# Patient Record
Sex: Male | Born: 1950 | Race: White | Hispanic: No | Marital: Single
Health system: Southern US, Community
[De-identification: ages and names within clinical notes are randomized; demographics above are authoritative.]

---

## 2006-08-01 ENCOUNTER — Other Ambulatory Visit: Payer: Self-pay

## 2006-08-01 ENCOUNTER — Emergency Department: Payer: Self-pay | Admitting: Emergency Medicine

## 2007-05-02 ENCOUNTER — Emergency Department: Payer: Self-pay | Admitting: Emergency Medicine

## 2007-05-02 ENCOUNTER — Other Ambulatory Visit: Payer: Self-pay

## 2007-07-28 ENCOUNTER — Other Ambulatory Visit: Payer: Self-pay

## 2007-07-28 ENCOUNTER — Inpatient Hospital Stay: Payer: Self-pay | Admitting: Internal Medicine

## 2008-02-03 ENCOUNTER — Ambulatory Visit: Payer: Self-pay | Admitting: Family Medicine

## 2008-10-17 ENCOUNTER — Emergency Department: Payer: Self-pay | Admitting: Emergency Medicine

## 2009-03-05 ENCOUNTER — Inpatient Hospital Stay: Payer: Self-pay | Admitting: Internal Medicine

## 2010-10-26 ENCOUNTER — Ambulatory Visit: Payer: Self-pay | Admitting: Oncology

## 2010-11-12 ENCOUNTER — Inpatient Hospital Stay: Payer: Self-pay | Admitting: Internal Medicine

## 2010-11-26 ENCOUNTER — Ambulatory Visit: Payer: Self-pay | Admitting: Oncology

## 2012-01-11 ENCOUNTER — Emergency Department: Payer: Self-pay | Admitting: *Deleted

## 2012-01-26 ENCOUNTER — Ambulatory Visit: Payer: Self-pay | Admitting: Internal Medicine

## 2012-02-19 LAB — COMPREHENSIVE METABOLIC PANEL
Albumin: 1.8 g/dL — ABNORMAL LOW (ref 3.4–5.0)
Alkaline Phosphatase: 178 U/L — ABNORMAL HIGH (ref 50–136)
Anion Gap: 20 — ABNORMAL HIGH (ref 7–16)
BUN: 17 mg/dL (ref 7–18)
Co2: 16 mmol/L — ABNORMAL LOW (ref 21–32)
EGFR (Non-African Amer.): 51 — ABNORMAL LOW
Glucose: 28 mg/dL — CL (ref 65–99)
Osmolality: 253 (ref 275–301)
Potassium: 3.1 mmol/L — ABNORMAL LOW (ref 3.5–5.1)
SGOT(AST): 187 U/L — ABNORMAL HIGH (ref 15–37)
SGPT (ALT): 53 U/L
Sodium: 127 mmol/L — ABNORMAL LOW (ref 136–145)
Total Protein: 5.8 g/dL — ABNORMAL LOW (ref 6.4–8.2)

## 2012-02-19 LAB — CBC WITH DIFFERENTIAL/PLATELET
Basophil %: 0.2 %
Eosinophil #: 0 10*3/uL (ref 0.0–0.7)
HCT: 36.4 % — ABNORMAL LOW (ref 40.0–52.0)
HGB: 11.9 g/dL — ABNORMAL LOW (ref 13.0–18.0)
Lymphocyte #: 0.2 10*3/uL — ABNORMAL LOW (ref 1.0–3.6)
Lymphocyte %: 8.8 %
MCH: 27.9 pg (ref 26.0–34.0)
MCV: 86 fL (ref 80–100)
Monocyte %: 0.6 %
Neutrophil %: 90.1 %

## 2012-02-19 LAB — CK TOTAL AND CKMB (NOT AT ARMC): CK, Total: 981 U/L — ABNORMAL HIGH (ref 35–232)

## 2012-02-19 LAB — TROPONIN I: Troponin-I: 0.17 ng/mL — ABNORMAL HIGH

## 2012-02-20 ENCOUNTER — Inpatient Hospital Stay: Payer: Self-pay | Admitting: Internal Medicine

## 2012-02-20 LAB — TROPONIN I: Troponin-I: 0.18 ng/mL — ABNORMAL HIGH

## 2012-02-20 LAB — BASIC METABOLIC PANEL
Anion Gap: 13 (ref 7–16)
BUN: 22 mg/dL — ABNORMAL HIGH (ref 7–18)
Chloride: 99 mmol/L (ref 98–107)
Creatinine: 1.58 mg/dL — ABNORMAL HIGH (ref 0.60–1.30)
EGFR (African American): 54 — ABNORMAL LOW
Glucose: 138 mg/dL — ABNORMAL HIGH (ref 65–99)
Osmolality: 264 (ref 275–301)
Sodium: 129 mmol/L — ABNORMAL LOW (ref 136–145)

## 2012-02-20 LAB — LIPID PANEL: VLDL Cholesterol, Calc: 20 mg/dL (ref 5–40)

## 2012-02-20 LAB — URINALYSIS, COMPLETE
Bilirubin,UR: NEGATIVE
Glucose,UR: 50 mg/dL (ref 0–75)
Hyaline Cast: 4
Ketone: NEGATIVE
Leukocyte Esterase: NEGATIVE
Ph: 5 (ref 4.5–8.0)
Protein: 100
RBC,UR: 14 /HPF (ref 0–5)
Specific Gravity: 1.014 (ref 1.003–1.030)
WBC UR: 5 /HPF (ref 0–5)

## 2012-02-20 LAB — DRUG SCREEN, URINE
Cocaine Metabolite,Ur ~~LOC~~: NEGATIVE (ref ?–300)
MDMA (Ecstasy)Ur Screen: NEGATIVE (ref ?–500)
Methadone, Ur Screen: NEGATIVE (ref ?–300)
Opiate, Ur Screen: POSITIVE (ref ?–300)
Phencyclidine (PCP) Ur S: NEGATIVE (ref ?–25)
Tricyclic, Ur Screen: NEGATIVE (ref ?–1000)

## 2012-02-20 LAB — FOLATE: Folic Acid: 5.3 ng/mL (ref 3.1–100.0)

## 2012-02-20 LAB — HEMOGLOBIN A1C: Hemoglobin A1C: 5.5 % (ref 4.2–6.3)

## 2012-02-20 LAB — CK TOTAL AND CKMB (NOT AT ARMC)
CK, Total: 1312 U/L — ABNORMAL HIGH (ref 35–232)
CK, Total: 843 U/L — ABNORMAL HIGH (ref 35–232)
CK-MB: 11.6 ng/mL — ABNORMAL HIGH (ref 0.5–3.6)
CK-MB: 13.1 ng/mL — ABNORMAL HIGH (ref 0.5–3.6)

## 2012-02-21 LAB — CBC WITH DIFFERENTIAL/PLATELET
Basophil #: 0 10*3/uL (ref 0.0–0.1)
Basophil %: 0.3 %
Eosinophil #: 0.1 10*3/uL (ref 0.0–0.7)
Eosinophil %: 1.3 %
HCT: 29.8 % — ABNORMAL LOW (ref 40.0–52.0)
MCH: 28.4 pg (ref 26.0–34.0)
MCHC: 33.9 g/dL (ref 32.0–36.0)
MCV: 84 fL (ref 80–100)
Monocyte #: 0 x10 3/mm — ABNORMAL LOW (ref 0.2–1.0)
Monocyte %: 0.3 %
Platelet: 53 10*3/uL — ABNORMAL LOW (ref 150–440)
RBC: 3.56 10*6/uL — ABNORMAL LOW (ref 4.40–5.90)
RDW: 15.3 % — ABNORMAL HIGH (ref 11.5–14.5)
WBC: 4.5 10*3/uL (ref 3.8–10.6)

## 2012-02-21 LAB — COMPREHENSIVE METABOLIC PANEL
Albumin: 1.6 g/dL — ABNORMAL LOW (ref 3.4–5.0)
Anion Gap: 12 (ref 7–16)
BUN: 22 mg/dL — ABNORMAL HIGH (ref 7–18)
Bilirubin,Total: 1.5 mg/dL — ABNORMAL HIGH (ref 0.2–1.0)
Chloride: 103 mmol/L (ref 98–107)
Creatinine: 1.11 mg/dL (ref 0.60–1.30)
EGFR (Non-African Amer.): >60
Osmolality: 279 (ref 275–301)
Potassium: 3 mmol/L — ABNORMAL LOW (ref 3.5–5.1)
SGOT(AST): 191 U/L — ABNORMAL HIGH (ref 15–37)
SGPT (ALT): 68 U/L
Total Protein: 5.4 g/dL — ABNORMAL LOW (ref 6.4–8.2)

## 2012-02-21 LAB — PROTIME-INR: Prothrombin Time: 14.5 secs (ref 11.5–14.7)

## 2012-02-21 LAB — POTASSIUM: Potassium: 3.7 mmol/L (ref 3.5–5.1)

## 2012-02-21 LAB — HEMOGLOBIN A1C: Hemoglobin A1C: 5.5 % (ref 4.2–6.3)

## 2012-02-21 LAB — MAGNESIUM: Magnesium: 2 mg/dL

## 2012-02-21 LAB — PHENYTOIN LEVEL, TOTAL: Dilantin: 5.6 ug/mL — ABNORMAL LOW (ref 10.0–20.0)

## 2012-02-21 LAB — AMMONIA: Ammonia, Plasma: <25 mcmol/L (ref 11–32)

## 2012-02-22 LAB — BASIC METABOLIC PANEL
Calcium, Total: 8.2 mg/dL — ABNORMAL LOW (ref 8.5–10.1)
Chloride: 108 mmol/L — ABNORMAL HIGH (ref 98–107)
EGFR (African American): >60
EGFR (Non-African Amer.): >60
Glucose: 143 mg/dL — ABNORMAL HIGH (ref 65–99)
Osmolality: 282 (ref 275–301)
Sodium: 138 mmol/L (ref 136–145)

## 2012-02-22 LAB — CBC WITH DIFFERENTIAL/PLATELET
Basophil %: 0.3 %
Eosinophil #: 0 10*3/uL (ref 0.0–0.7)
Eosinophil %: 0.2 %
Lymphocyte #: 0.6 10*3/uL — ABNORMAL LOW (ref 1.0–3.6)
MCH: 27.9 pg (ref 26.0–34.0)
MCHC: 33.4 g/dL (ref 32.0–36.0)
Monocyte #: 0.1 x10 3/mm — ABNORMAL LOW (ref 0.2–1.0)
Monocyte %: 3.5 %
Neutrophil #: 3 10*3/uL (ref 1.4–6.5)
Neutrophil %: 80.1 %
RBC: 4.2 10*6/uL — ABNORMAL LOW (ref 4.40–5.90)
WBC: 3.8 10*3/uL (ref 3.8–10.6)

## 2012-02-23 LAB — DIFFERENTIAL
Basophil #: 0 10*3/uL (ref 0.0–0.1)
Eosinophil #: 0 10*3/uL (ref 0.0–0.7)
Lymphocyte %: 14.9 %
Monocyte #: 0.1 x10 3/mm — ABNORMAL LOW (ref 0.2–1.0)
Monocyte %: 4.2 %
Neutrophil %: 80.7 %

## 2012-02-24 LAB — POTASSIUM: Potassium: 3.6 mmol/L (ref 3.5–5.1)

## 2012-02-24 LAB — PHOSPHORUS: Phosphorus: 3 mg/dL (ref 2.5–4.9)

## 2012-02-24 LAB — MAGNESIUM: Magnesium: 1.6 mg/dL — ABNORMAL LOW

## 2012-02-24 LAB — CALCIUM: Calcium, Total: 7.6 mg/dL — ABNORMAL LOW (ref 8.5–10.1)

## 2012-02-25 ENCOUNTER — Ambulatory Visit: Payer: Self-pay | Admitting: Internal Medicine

## 2012-02-25 LAB — CALCIUM: Calcium, Total: 7.5 mg/dL — ABNORMAL LOW (ref 8.5–10.1)

## 2012-02-25 LAB — PHOSPHORUS: Phosphorus: 3.1 mg/dL (ref 2.5–4.9)

## 2012-02-25 LAB — POTASSIUM: Potassium: 3.8 mmol/L (ref 3.5–5.1)

## 2012-02-26 LAB — POTASSIUM: Potassium: 3.9 mmol/L (ref 3.5–5.1)

## 2012-02-26 LAB — PHOSPHORUS: Phosphorus: 2.9 mg/dL (ref 2.5–4.9)

## 2012-02-26 LAB — CALCIUM: Calcium, Total: 7.4 mg/dL — ABNORMAL LOW (ref 8.5–10.1)

## 2012-02-29 LAB — CULTURE, BLOOD (SINGLE)

## 2012-03-19 LAB — CULTURE, BLOOD (SINGLE)

## 2014-04-04 ENCOUNTER — Emergency Department: Payer: Self-pay | Admitting: Emergency Medicine

## 2014-04-04 LAB — CBC WITH DIFFERENTIAL/PLATELET
Comment - H1-Com1: NORMAL
Eosinophil: 1 %
HCT: 43.8 % (ref 40.0–52.0)
HGB: 14.8 g/dL (ref 13.0–18.0)
Lymphocytes: 75 %
MCH: 30.8 pg (ref 26.0–34.0)
MCHC: 33.8 g/dL (ref 32.0–36.0)
MCV: 91 fL (ref 80–100)
MONOS PCT: 8 %
Platelet: 139 10*3/uL — ABNORMAL LOW (ref 150–440)
RBC: 4.8 10*6/uL (ref 4.40–5.90)
RDW: 14.4 % (ref 11.5–14.5)
Segmented Neutrophils: 10 %
Variant Lymphocyte - H1-Rlymph: 6 %
WBC: 3.3 10*3/uL — ABNORMAL LOW (ref 3.8–10.6)

## 2014-04-05 LAB — URINALYSIS, COMPLETE
BILIRUBIN, UR: NEGATIVE
Bacteria: NONE SEEN
Blood: NEGATIVE
GLUCOSE, UR: NEGATIVE mg/dL (ref 0–75)
KETONE: NEGATIVE
LEUKOCYTE ESTERASE: NEGATIVE
NITRITE: NEGATIVE
PH: 5 (ref 4.5–8.0)
Protein: NEGATIVE
RBC,UR: 1 /HPF (ref 0–5)
Specific Gravity: 1.006 (ref 1.003–1.030)
Squamous Epithelial: NONE SEEN

## 2014-04-05 LAB — DRUG SCREEN, URINE

## 2014-04-05 LAB — COMPREHENSIVE METABOLIC PANEL
ALBUMIN: 3.3 g/dL — AB (ref 3.4–5.0)
ALT: 15 U/L
AST: 20 U/L (ref 15–37)
Alkaline Phosphatase: 98 U/L
Anion Gap: 8 (ref 7–16)
BUN: 12 mg/dL (ref 7–18)
Bilirubin,Total: 0.4 mg/dL (ref 0.2–1.0)
CREATININE: 0.85 mg/dL (ref 0.60–1.30)
Calcium, Total: 7.8 mg/dL — ABNORMAL LOW (ref 8.5–10.1)
Chloride: 98 mmol/L (ref 98–107)
Co2: 25 mmol/L (ref 21–32)
EGFR (African American): >60
EGFR (Non-African Amer.): >60
GLUCOSE: 84 mg/dL (ref 65–99)
Osmolality: 262 (ref 275–301)
Potassium: 4 mmol/L (ref 3.5–5.1)
SODIUM: 131 mmol/L — AB (ref 136–145)
TOTAL PROTEIN: 6.7 g/dL (ref 6.4–8.2)

## 2014-04-05 LAB — ETHANOL
ETHANOL LVL: 168 mg/dL
Ethanol %: 0.168 % — ABNORMAL HIGH (ref 0.000–0.080)

## 2014-06-27 ENCOUNTER — Ambulatory Visit: Payer: Self-pay | Admitting: Nurse Practitioner

## 2014-07-20 ENCOUNTER — Inpatient Hospital Stay: Payer: Self-pay | Admitting: Internal Medicine

## 2014-07-20 LAB — CBC
HCT: 42.5 % (ref 40.0–52.0)
HGB: 14.1 g/dL (ref 13.0–18.0)
MCH: 29.2 pg (ref 26.0–34.0)
MCHC: 33.1 g/dL (ref 32.0–36.0)
MCV: 88 fL (ref 80–100)
Platelet: 276 10*3/uL (ref 150–440)
RBC: 4.81 10*6/uL (ref 4.40–5.90)
RDW: 14.7 % — ABNORMAL HIGH (ref 11.5–14.5)
WBC: 4.4 10*3/uL (ref 3.8–10.6)

## 2014-07-20 LAB — COMPREHENSIVE METABOLIC PANEL
ALT: 23 U/L
Albumin: 3.3 g/dL — ABNORMAL LOW (ref 3.4–5.0)
Alkaline Phosphatase: 148 U/L — ABNORMAL HIGH
Anion Gap: 8 (ref 7–16)
BUN: 28 mg/dL — ABNORMAL HIGH (ref 7–18)
Bilirubin,Total: 0.7 mg/dL (ref 0.2–1.0)
CHLORIDE: 101 mmol/L (ref 98–107)
Calcium, Total: 8.3 mg/dL — ABNORMAL LOW (ref 8.5–10.1)
Co2: 24 mmol/L (ref 21–32)
Creatinine: 1.3 mg/dL (ref 0.60–1.30)
EGFR (African American): >60
GFR CALC NON AF AMER: 59 — AB
Glucose: 93 mg/dL (ref 65–99)
Osmolality: 272 (ref 275–301)
Potassium: 4.2 mmol/L (ref 3.5–5.1)
SGOT(AST): 42 U/L — ABNORMAL HIGH (ref 15–37)
Sodium: 133 mmol/L — ABNORMAL LOW (ref 136–145)
Total Protein: 7.5 g/dL (ref 6.4–8.2)

## 2014-07-20 LAB — TROPONIN I: Troponin-I: <0.02 ng/mL

## 2014-07-20 LAB — ETHANOL: Ethanol: <3 mg/dL

## 2014-07-21 LAB — URINALYSIS, COMPLETE
Bilirubin,UR: NEGATIVE
GLUCOSE, UR: NEGATIVE mg/dL (ref 0–75)
Hyaline Cast: 6
Nitrite: NEGATIVE
Ph: 5 (ref 4.5–8.0)
Protein: NEGATIVE
RBC,UR: <1 /HPF (ref 0–5)
Specific Gravity: 1.019 (ref 1.003–1.030)
Squamous Epithelial: NONE SEEN
WBC UR: 9 /HPF (ref 0–5)

## 2014-07-21 LAB — DRUG SCREEN, URINE
AMPHETAMINES, UR SCREEN: NEGATIVE (ref ?–1000)
Barbiturates, Ur Screen: NEGATIVE (ref ?–200)
Benzodiazepine, Ur Scrn: NEGATIVE (ref ?–200)
COCAINE METABOLITE, UR ~~LOC~~: NEGATIVE (ref ?–300)
Cannabinoid 50 Ng, Ur ~~LOC~~: NEGATIVE (ref ?–50)
MDMA (ECSTASY) UR SCREEN: NEGATIVE (ref ?–500)
Methadone, Ur Screen: NEGATIVE (ref ?–300)
Opiate, Ur Screen: NEGATIVE (ref ?–300)
PHENCYCLIDINE (PCP) UR S: NEGATIVE (ref ?–25)
TRICYCLIC, UR SCREEN: NEGATIVE (ref ?–1000)

## 2014-07-21 LAB — PHOSPHORUS: Phosphorus: 3.2 mg/dL (ref 2.5–4.9)

## 2014-07-21 LAB — AMMONIA: AMMONIA, PLASMA: 11 umol/L (ref 11–32)

## 2014-07-21 LAB — MAGNESIUM: MAGNESIUM: 1.7 mg/dL — AB

## 2014-07-22 LAB — BASIC METABOLIC PANEL
ANION GAP: 6 — AB (ref 7–16)
BUN: 10 mg/dL (ref 7–18)
CO2: 23 mmol/L (ref 21–32)
CREATININE: 0.65 mg/dL (ref 0.60–1.30)
Calcium, Total: 7.7 mg/dL — ABNORMAL LOW (ref 8.5–10.1)
Chloride: 110 mmol/L — ABNORMAL HIGH (ref 98–107)
EGFR (African American): >60
EGFR (Non-African Amer.): >60
Glucose: 91 mg/dL (ref 65–99)
Osmolality: 276 (ref 275–301)
Potassium: 3.6 mmol/L (ref 3.5–5.1)
Sodium: 139 mmol/L (ref 136–145)

## 2014-07-22 LAB — CBC WITH DIFFERENTIAL/PLATELET
BASOS ABS: 0 10*3/uL (ref 0.0–0.1)
Basophil %: 0.8 %
EOS ABS: 0 10*3/uL (ref 0.0–0.7)
Eosinophil %: 1.1 %
HCT: 39.6 % — ABNORMAL LOW (ref 40.0–52.0)
HGB: 13.1 g/dL (ref 13.0–18.0)
LYMPHS ABS: 0.8 10*3/uL — AB (ref 1.0–3.6)
LYMPHS PCT: 28.9 %
MCH: 29.3 pg (ref 26.0–34.0)
MCHC: 33.1 g/dL (ref 32.0–36.0)
MCV: 89 fL (ref 80–100)
MONOS PCT: 16.7 %
Monocyte #: 0.4 x10 3/mm (ref 0.2–1.0)
NEUTROS PCT: 52.5 %
Neutrophil #: 1.4 10*3/uL (ref 1.4–6.5)
Platelet: 209 10*3/uL (ref 150–440)
RBC: 4.47 10*6/uL (ref 4.40–5.90)
RDW: 14.6 % — AB (ref 11.5–14.5)
WBC: 2.7 10*3/uL — AB (ref 3.8–10.6)

## 2014-07-22 LAB — MAGNESIUM: Magnesium: 1.7 mg/dL — ABNORMAL LOW

## 2014-07-23 LAB — BASIC METABOLIC PANEL
ANION GAP: 6 — AB (ref 7–16)
BUN: 20 mg/dL — AB (ref 7–18)
CALCIUM: 7.9 mg/dL — AB (ref 8.5–10.1)
CHLORIDE: 111 mmol/L — AB (ref 98–107)
CREATININE: 2.06 mg/dL — AB (ref 0.60–1.30)
Co2: 23 mmol/L (ref 21–32)
EGFR (African American): 42 — ABNORMAL LOW
EGFR (Non-African Amer.): 35 — ABNORMAL LOW
Glucose: 88 mg/dL (ref 65–99)
Osmolality: 281 (ref 275–301)
POTASSIUM: 4.5 mmol/L (ref 3.5–5.1)
SODIUM: 140 mmol/L (ref 136–145)

## 2014-07-23 LAB — CBC WITH DIFFERENTIAL/PLATELET
BASOS ABS: 0 10*3/uL (ref 0.0–0.1)
Basophil %: 0.3 %
EOS PCT: 0 %
Eosinophil #: 0 10*3/uL (ref 0.0–0.7)
HCT: 38.3 % — ABNORMAL LOW (ref 40.0–52.0)
HGB: 12.4 g/dL — AB (ref 13.0–18.0)
Lymphocyte #: 0.8 10*3/uL — ABNORMAL LOW (ref 1.0–3.6)
Lymphocyte %: 11.3 %
MCH: 29.3 pg (ref 26.0–34.0)
MCHC: 32.5 g/dL (ref 32.0–36.0)
MCV: 90 fL (ref 80–100)
Monocyte #: 0.9 x10 3/mm (ref 0.2–1.0)
Monocyte %: 13.5 %
NEUTROS PCT: 74.9 %
Neutrophil #: 5.1 10*3/uL (ref 1.4–6.5)
PLATELETS: 269 10*3/uL (ref 150–440)
RBC: 4.24 10*6/uL — ABNORMAL LOW (ref 4.40–5.90)
RDW: 14.6 % — ABNORMAL HIGH (ref 11.5–14.5)
WBC: 6.8 10*3/uL (ref 3.8–10.6)

## 2014-07-23 LAB — PHOSPHORUS: PHOSPHORUS: 6.2 mg/dL — AB (ref 2.5–4.9)

## 2014-07-23 LAB — MAGNESIUM
MAGNESIUM: 1.7 mg/dL — AB
MAGNESIUM: 2.5 mg/dL — AB

## 2014-07-24 LAB — BASIC METABOLIC PANEL
ANION GAP: 9 (ref 7–16)
BUN: 24 mg/dL — AB (ref 7–18)
CALCIUM: 7.6 mg/dL — AB (ref 8.5–10.1)
CHLORIDE: 112 mmol/L — AB (ref 98–107)
Co2: 18 mmol/L — ABNORMAL LOW (ref 21–32)
Creatinine: 2.08 mg/dL — ABNORMAL HIGH (ref 0.60–1.30)
EGFR (Non-African Amer.): 34 — ABNORMAL LOW
GFR CALC AF AMER: 42 — AB
Glucose: 114 mg/dL — ABNORMAL HIGH (ref 65–99)
OSMOLALITY: 282 (ref 275–301)
POTASSIUM: 3.3 mmol/L — AB (ref 3.5–5.1)
SODIUM: 139 mmol/L (ref 136–145)

## 2014-07-24 LAB — PHOSPHORUS: PHOSPHORUS: 3.6 mg/dL (ref 2.5–4.9)

## 2014-07-24 LAB — MAGNESIUM: Magnesium: 2.6 mg/dL — ABNORMAL HIGH

## 2014-07-25 LAB — CBC WITH DIFFERENTIAL/PLATELET
BASOS PCT: 0.7 %
Basophil #: 0 10*3/uL (ref 0.0–0.1)
EOS ABS: 0.1 10*3/uL (ref 0.0–0.7)
Eosinophil %: 1.5 %
HCT: 35 % — ABNORMAL LOW (ref 40.0–52.0)
HGB: 11.5 g/dL — ABNORMAL LOW (ref 13.0–18.0)
LYMPHS PCT: 29 %
Lymphocyte #: 1.1 10*3/uL (ref 1.0–3.6)
MCH: 28.9 pg (ref 26.0–34.0)
MCHC: 32.9 g/dL (ref 32.0–36.0)
MCV: 88 fL (ref 80–100)
MONOS PCT: 12 %
Monocyte #: 0.4 x10 3/mm (ref 0.2–1.0)
NEUTROS PCT: 56.8 %
Neutrophil #: 2.1 10*3/uL (ref 1.4–6.5)
Platelet: 213 10*3/uL (ref 150–440)
RBC: 3.98 10*6/uL — ABNORMAL LOW (ref 4.40–5.90)
RDW: 15.4 % — ABNORMAL HIGH (ref 11.5–14.5)
WBC: 3.7 10*3/uL — ABNORMAL LOW (ref 3.8–10.6)

## 2014-07-25 LAB — PHENYTOIN LEVEL, TOTAL: Dilantin: 13.6 ug/mL (ref 10.0–20.0)

## 2014-07-25 LAB — CULTURE, BLOOD (SINGLE)

## 2014-07-25 LAB — BASIC METABOLIC PANEL
Anion Gap: 9 (ref 7–16)
BUN: 29 mg/dL — ABNORMAL HIGH (ref 7–18)
CHLORIDE: 115 mmol/L — AB (ref 98–107)
Calcium, Total: 7.4 mg/dL — ABNORMAL LOW (ref 8.5–10.1)
Co2: 19 mmol/L — ABNORMAL LOW (ref 21–32)
Creatinine: 1.91 mg/dL — ABNORMAL HIGH (ref 0.60–1.30)
EGFR (African American): 46 — ABNORMAL LOW
EGFR (Non-African Amer.): 38 — ABNORMAL LOW
Glucose: 125 mg/dL — ABNORMAL HIGH (ref 65–99)
OSMOLALITY: 292 (ref 275–301)
Potassium: 3.5 mmol/L (ref 3.5–5.1)
SODIUM: 143 mmol/L (ref 136–145)

## 2014-07-26 LAB — BASIC METABOLIC PANEL
ANION GAP: 6 — AB (ref 7–16)
BUN: 33 mg/dL — ABNORMAL HIGH (ref 7–18)
CALCIUM: 7.5 mg/dL — AB (ref 8.5–10.1)
CHLORIDE: 119 mmol/L — AB (ref 98–107)
Co2: 21 mmol/L (ref 21–32)
Creatinine: 1.6 mg/dL — ABNORMAL HIGH (ref 0.60–1.30)
EGFR (Non-African Amer.): 47 — ABNORMAL LOW
GFR CALC AF AMER: 57 — AB
Glucose: 86 mg/dL (ref 65–99)
OSMOLALITY: 297 (ref 275–301)
POTASSIUM: 3.5 mmol/L (ref 3.5–5.1)
Sodium: 146 mmol/L — ABNORMAL HIGH (ref 136–145)

## 2014-07-26 LAB — PHOSPHORUS: Phosphorus: 2.9 mg/dL (ref 2.5–4.9)

## 2014-07-26 LAB — EXPECTORATED SPUTUM ASSESSMENT W REFEX TO RESP CULTURE

## 2014-07-26 LAB — MAGNESIUM: MAGNESIUM: 1.9 mg/dL

## 2014-07-27 ENCOUNTER — Ambulatory Visit: Payer: Self-pay | Admitting: Nurse Practitioner

## 2014-07-27 LAB — BASIC METABOLIC PANEL
Anion Gap: 9 (ref 7–16)
BUN: 30 mg/dL — ABNORMAL HIGH (ref 7–18)
CALCIUM: 7.5 mg/dL — AB (ref 8.5–10.1)
Chloride: 117 mmol/L — ABNORMAL HIGH (ref 98–107)
Co2: 22 mmol/L (ref 21–32)
Creatinine: 1.42 mg/dL — ABNORMAL HIGH (ref 0.60–1.30)
EGFR (African American): >60
EGFR (Non-African Amer.): 54 — ABNORMAL LOW
GLUCOSE: 122 mg/dL — AB (ref 65–99)
Osmolality: 302 (ref 275–301)
Potassium: 3.4 mmol/L — ABNORMAL LOW (ref 3.5–5.1)
SODIUM: 148 mmol/L — AB (ref 136–145)

## 2014-07-27 LAB — POTASSIUM: Potassium: 3.9 mmol/L (ref 3.5–5.1)

## 2014-07-27 LAB — ALBUMIN: Albumin: 1.6 g/dL — ABNORMAL LOW (ref 3.4–5.0)

## 2014-07-27 LAB — MAGNESIUM: MAGNESIUM: 1.8 mg/dL

## 2014-07-27 LAB — PHOSPHORUS: Phosphorus: 3.1 mg/dL (ref 2.5–4.9)

## 2014-07-28 LAB — MAGNESIUM: Magnesium: 2 mg/dL

## 2014-07-28 LAB — CLOSTRIDIUM DIFFICILE(ARMC)

## 2014-07-28 LAB — BASIC METABOLIC PANEL
Anion Gap: 7 (ref 7–16)
BUN: 26 mg/dL — AB (ref 7–18)
CALCIUM: 7.8 mg/dL — AB (ref 8.5–10.1)
CHLORIDE: 115 mmol/L — AB (ref 98–107)
CO2: 25 mmol/L (ref 21–32)
CREATININE: 1.12 mg/dL (ref 0.60–1.30)
EGFR (African American): >60
EGFR (Non-African Amer.): >60
GLUCOSE: 95 mg/dL (ref 65–99)
OSMOLALITY: 297 (ref 275–301)
Potassium: 3.6 mmol/L (ref 3.5–5.1)
SODIUM: 147 mmol/L — AB (ref 136–145)

## 2014-07-28 LAB — PHOSPHORUS: Phosphorus: 3.7 mg/dL (ref 2.5–4.9)

## 2014-07-29 LAB — PHENYTOIN LEVEL, TOTAL: Dilantin: 7.6 ug/mL — ABNORMAL LOW (ref 10.0–20.0)

## 2014-07-29 LAB — BASIC METABOLIC PANEL
ANION GAP: 8 (ref 7–16)
BUN: 23 mg/dL — ABNORMAL HIGH (ref 7–18)
Calcium, Total: 7.6 mg/dL — ABNORMAL LOW (ref 8.5–10.1)
Chloride: 113 mmol/L — ABNORMAL HIGH (ref 98–107)
Co2: 25 mmol/L (ref 21–32)
Creatinine: 1.09 mg/dL (ref 0.60–1.30)
EGFR (Non-African Amer.): >60
Glucose: 98 mg/dL (ref 65–99)
OSMOLALITY: 294 (ref 275–301)
POTASSIUM: 3.5 mmol/L (ref 3.5–5.1)
Sodium: 146 mmol/L — ABNORMAL HIGH (ref 136–145)

## 2014-07-29 LAB — PLATELET COUNT: PLATELETS: 185 10*3/uL (ref 150–440)

## 2014-07-29 LAB — PHOSPHORUS: PHOSPHORUS: 3.6 mg/dL (ref 2.5–4.9)

## 2014-07-29 LAB — MAGNESIUM: MAGNESIUM: 1.6 mg/dL — AB

## 2014-07-30 LAB — BASIC METABOLIC PANEL
Anion Gap: 10 (ref 7–16)
BUN: 13 mg/dL (ref 7–18)
CO2: 25 mmol/L (ref 21–32)
CREATININE: 1.04 mg/dL (ref 0.60–1.30)
Calcium, Total: 7.6 mg/dL — ABNORMAL LOW (ref 8.5–10.1)
Chloride: 105 mmol/L (ref 98–107)
EGFR (African American): >60
EGFR (Non-African Amer.): >60
Glucose: 103 mg/dL — ABNORMAL HIGH (ref 65–99)
Osmolality: 280 (ref 275–301)
POTASSIUM: 3.8 mmol/L (ref 3.5–5.1)
Sodium: 140 mmol/L (ref 136–145)

## 2014-07-30 LAB — MAGNESIUM: MAGNESIUM: 1.6 mg/dL — AB

## 2014-07-31 LAB — BASIC METABOLIC PANEL
Anion Gap: 5 — ABNORMAL LOW (ref 7–16)
BUN: 11 mg/dL (ref 7–18)
Calcium, Total: 7.7 mg/dL — ABNORMAL LOW (ref 8.5–10.1)
Chloride: 100 mmol/L (ref 98–107)
Co2: 29 mmol/L (ref 21–32)
Creatinine: 0.89 mg/dL (ref 0.60–1.30)
EGFR (African American): >60
EGFR (Non-African Amer.): >60
Glucose: 96 mg/dL (ref 65–99)
Osmolality: 268 (ref 275–301)
Potassium: 3.7 mmol/L (ref 3.5–5.1)
SODIUM: 134 mmol/L — AB (ref 136–145)

## 2014-07-31 LAB — CBC WITH DIFFERENTIAL/PLATELET
BASOS ABS: 0 10*3/uL (ref 0.0–0.1)
BASOS PCT: 0.7 %
EOS PCT: 3.8 %
Eosinophil #: 0.1 10*3/uL (ref 0.0–0.7)
HCT: 37.1 % — AB (ref 40.0–52.0)
HGB: 12.2 g/dL — AB (ref 13.0–18.0)
Lymphocyte #: 1.7 10*3/uL (ref 1.0–3.6)
Lymphocyte %: 43.2 %
MCH: 28.4 pg (ref 26.0–34.0)
MCHC: 32.9 g/dL (ref 32.0–36.0)
MCV: 86 fL (ref 80–100)
Monocyte #: 0.5 x10 3/mm (ref 0.2–1.0)
Monocyte %: 11.5 %
NEUTROS PCT: 40.8 %
Neutrophil #: 1.6 10*3/uL (ref 1.4–6.5)
PLATELETS: 201 10*3/uL (ref 150–440)
RBC: 4.3 10*6/uL — AB (ref 4.40–5.90)
RDW: 14.5 % (ref 11.5–14.5)
WBC: 3.9 10*3/uL (ref 3.8–10.6)

## 2014-07-31 LAB — MAGNESIUM: Magnesium: 1.6 mg/dL — ABNORMAL LOW

## 2014-08-01 LAB — MAGNESIUM: Magnesium: 1.8 mg/dL

## 2014-08-02 LAB — PHENYTOIN LEVEL, TOTAL: Dilantin: 5.2 ug/mL — ABNORMAL LOW (ref 10.0–20.0)

## 2014-08-03 LAB — BASIC METABOLIC PANEL
ANION GAP: 5 — AB (ref 7–16)
BUN: 13 mg/dL (ref 7–18)
CREATININE: 1.02 mg/dL (ref 0.60–1.30)
Calcium, Total: 8.2 mg/dL — ABNORMAL LOW (ref 8.5–10.1)
Chloride: 98 mmol/L (ref 98–107)
Co2: 33 mmol/L — ABNORMAL HIGH (ref 21–32)
EGFR (African American): >60
EGFR (Non-African Amer.): >60
Glucose: 136 mg/dL — ABNORMAL HIGH (ref 65–99)
OSMOLALITY: 274 (ref 275–301)
POTASSIUM: 3.7 mmol/L (ref 3.5–5.1)
Sodium: 136 mmol/L (ref 136–145)

## 2014-08-04 ENCOUNTER — Non-Acute Institutional Stay (SKILLED_NURSING_FACILITY): Payer: Medicaid Other | Admitting: Internal Medicine

## 2014-08-04 DIAGNOSIS — J9600 Acute respiratory failure, unspecified whether with hypoxia or hypercapnia: Secondary | ICD-10-CM

## 2014-08-04 DIAGNOSIS — G40901 Epilepsy, unspecified, not intractable, with status epilepticus: Secondary | ICD-10-CM

## 2014-08-04 DIAGNOSIS — F1010 Alcohol abuse, uncomplicated: Secondary | ICD-10-CM

## 2014-08-04 DIAGNOSIS — G312 Degeneration of nervous system due to alcohol: Secondary | ICD-10-CM

## 2014-08-04 DIAGNOSIS — G3281 Cerebellar ataxia in diseases classified elsewhere: Secondary | ICD-10-CM

## 2014-08-04 DIAGNOSIS — J441 Chronic obstructive pulmonary disease with (acute) exacerbation: Secondary | ICD-10-CM

## 2014-08-04 DIAGNOSIS — F1020 Alcohol dependence, uncomplicated: Secondary | ICD-10-CM

## 2014-08-04 DIAGNOSIS — G40301 Generalized idiopathic epilepsy and epileptic syndromes, not intractable, with status epilepticus: Secondary | ICD-10-CM
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
## 2014-08-11 ENCOUNTER — Non-Acute Institutional Stay (SKILLED_NURSING_FACILITY): Payer: Medicaid Other | Admitting: Internal Medicine

## 2014-08-11 DIAGNOSIS — G40301 Generalized idiopathic epilepsy and epileptic syndromes, not intractable, with status epilepticus: Secondary | ICD-10-CM

## 2014-08-11 DIAGNOSIS — G40901 Epilepsy, unspecified, not intractable, with status epilepticus: Secondary | ICD-10-CM

## 2014-08-11 DIAGNOSIS — E559 Vitamin D deficiency, unspecified: Secondary | ICD-10-CM

## 2014-08-11 DIAGNOSIS — G312 Degeneration of nervous system due to alcohol: Principal | ICD-10-CM

## 2014-08-11 DIAGNOSIS — F1020 Alcohol dependence, uncomplicated: Secondary | ICD-10-CM

## 2014-08-11 DIAGNOSIS — G3281 Cerebellar ataxia in diseases classified elsewhere: Secondary | ICD-10-CM

## 2014-08-13 DIAGNOSIS — F1010 Alcohol abuse, uncomplicated: Secondary | ICD-10-CM | POA: Insufficient documentation

## 2014-08-13 DIAGNOSIS — G312 Degeneration of nervous system due to alcohol: Secondary | ICD-10-CM

## 2014-08-13 DIAGNOSIS — G40901 Epilepsy, unspecified, not intractable, with status epilepticus: Secondary | ICD-10-CM | POA: Insufficient documentation

## 2014-08-13 DIAGNOSIS — J9600 Acute respiratory failure, unspecified whether with hypoxia or hypercapnia: Secondary | ICD-10-CM | POA: Insufficient documentation

## 2014-08-13 DIAGNOSIS — F1020 Alcohol dependence, uncomplicated: Secondary | ICD-10-CM | POA: Insufficient documentation

## 2014-08-13 DIAGNOSIS — J441 Chronic obstructive pulmonary disease with (acute) exacerbation: Secondary | ICD-10-CM | POA: Insufficient documentation

## 2014-08-16 NOTE — Progress Notes (Addendum)
Patient ID: Donald Bradford, male   DOB: 1950/10/16, 63 y.o.   MRN: [id number]               PROGRESS NOTE  DATE:  08/11/2014     FACILITY: [facility]    LEVEL OF CARE:   SNF   Acute Visit   CHIEF COMPLAINT:  Follow up admission.    HISTORY OF PRESENT ILLNESS:  This is a man who was admitted to hospital at [facility] from 07/20/2014 through 07/30/2014.  He had acute  respiratory failure and acute encephalopathy secondary to status epilepticus and probable alcohol withdrawal.  He was extubated on 07/28/2014.  He was felt to have aspiration pneumonitis.  He was loaded with Dilantin and started on Keppra at the advice of Neurology.    When he arrived here, he had very little ancillary information with regards to his premorbid functional status.  Physical examination did not show any overt lateralizing signs until he actually stood up.  He was extremely ataxic with a wide-based gait.  This was very reminiscent of midline cerebellar degeneration, probably secondary to sustained alcohol abuse.    LABORATORY DATA:  Lab work has come back showing a hemoglobin of 11.8.  His indices are normal.  Differential count is normal.    Comprehensive metabolic panel normal except for an alk phos of 157 and an albumin of 3.4.    He has a 25-hydroxy vitamin D level of 18.2.    His vitamin B12 level is normal at 926.     His Dilantin level, that he was supposedly loaded with in the hospital and given 300 mg at discharge, was 0.9.    SOCIAL HISTORY:  Other than living with a girlfriend, whom we have not met, we have very little information on him.  Two brothers are listed as the next of kin.  The patient is listed as his own responsible party.      PHYSICAL EXAMINATION:   NEUROLOGICAL:    As mentioned, his exam is not really lateralizing until you stand him.   He is profoundly ataxic and a wide-based, staggering gait.    ASSESSMENT/PLAN:                                   Gait  ataxia.  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  It is possible that he has a component of peripheral neuropathy.    Refractory seizures while he was in hospital.   I have increased his Dilantin to 300 b.i.d.   There was a suggestion of status epilepticus.  He remains on Keppra at 1000 b.i.d.      Low vitamin D level.  I will start him on vitamin D3, aiming for a level of 50 ng/mL.

## 2014-08-27 ENCOUNTER — Ambulatory Visit: Payer: Self-pay | Admitting: Nurse Practitioner

## 2014-09-01 ENCOUNTER — Non-Acute Institutional Stay (SKILLED_NURSING_FACILITY): Payer: Medicaid Other | Admitting: Internal Medicine

## 2014-09-01 DIAGNOSIS — G40909 Epilepsy, unspecified, not intractable, without status epilepticus: Secondary | ICD-10-CM

## 2014-09-01 DIAGNOSIS — G312 Degeneration of nervous system due to alcohol: Principal | ICD-10-CM

## 2014-09-01 DIAGNOSIS — F1020 Alcohol dependence, uncomplicated: Secondary | ICD-10-CM

## 2014-09-01 DIAGNOSIS — G3281 Cerebellar ataxia in diseases classified elsewhere: Secondary | ICD-10-CM

## 2014-09-06 NOTE — Progress Notes (Signed)
Patient ID: Donald Bradford, male   DOB: January 29, 1951, 64 y.o.   MRN: [id number]               PROGRESS NOTE  DATE:  09/01/2014                 [facility]    LEVEL OF CARE:   SNF   Acute Visit   CHIEF COMPLAINT:  Skin issues.    HISTORY OF PRESENT ILLNESS:  This is a man whom I admitted in November after being admitted to [facility] with status epilepticus, felt to be secondary to alcohol withdrawal.  He required intubation.  He was felt to have aspiration pneumonitis.  He was loaded with Dilantin and started on Keppra.    When he arrived here, he had severe gait ataxia, a low 25-hydroxy vitamin D level.    His Dilantin level initially was 0.9 on 300 mg.  We increased his Dilantin to 300 b.i.d. on 08/10/2014.  He will need a repeat Dilantin level.    I never did get any information on his premorbid functional status.  He had extreme gait ataxia with a wide-based, unsteady gait.  This has gotten somewhat better.    PHYSICAL EXAMINATION:        NEUROLOGICAL:    BALANCE/GAIT:  He is able to bring himself to a standing position.  This is quite an improvement.  His gait is less unsteady than when I first saw him, but still wide-based and ataxic.  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   SKIN:  INSPECTION:  He had several excoriations when he first came in.  There is an area on his left lateral thigh that has not healed.  It is hypergranulated, looks clean.  It will need some debridement, either with a scalpel or with silver nitrate.    Also, on his left parietal scalp, he has a large blackened area.  This actually has hair growing through it, which makes me think that this is not a new thing, probably a longstanding area.  Nevertheless, I thought that Dermatology was probably the best route to go for this.  Without any collateral history, I cannot be certain that  this is a longstanding hyperkeratotic lesion.    ASSESSMENT/PLAN:                     Generalized seizure disorder.  He needs a recheck Dilantin level.  I will order for next week.    Gait ataxia, which I felt might be midline cerebellar degeneration.  He is actually some better.  I thought this was somewhat surprising.    Low 25-hydroxy vitamin D level.  He is on supplements.  Aim for a level of 50.    Wound on the left lateral thigh.  The exact cause of this is not totally clear.  However, it is clean and not infected.  It simply needs removal of the hypergranulated tissue.  I discussed this today with the wound care nurse.     CPT CODE: 16967

## 2014-09-08 ENCOUNTER — Non-Acute Institutional Stay (SKILLED_NURSING_FACILITY): Payer: Medicaid Other | Admitting: Internal Medicine

## 2014-09-08 DIAGNOSIS — G40909 Epilepsy, unspecified, not intractable, without status epilepticus: Secondary | ICD-10-CM

## 2014-09-08 DIAGNOSIS — T420X1A Poisoning by hydantoin derivatives, accidental (unintentional), initial encounter: Secondary | ICD-10-CM

## 2014-09-13 NOTE — Progress Notes (Signed)
Patient ID: Donald Bradford, male   DOB: 1951/04/17, 64 y.o.   MRN: [id number]               PROGRESS NOTE  DATE:  09/08/2014               [facility]               LEVEL OF CARE:   SNF   Acute Visit   CHIEF COMPLAINT:  Dilantin toxicity.                   HISTORY OF PRESENT ILLNESS:  This is a man whom I admitted here in November after being admitted to [facility] with status epilepticus, felt to be secondary to alcohol withdrawal.   He was loaded with Dilantin.  He came here on Dilantin 300 mg a day, which gave him a level of 0.9.  I increased this to 300 b.i.d. on 07/11/2014.  A level was ordered for one week.  I do not think this was ever done.  In any case, it was finally repeated on 09/06/2014 at 29.5.                 PHYSICAL EXAMINATION:      GENERAL APPEARANCE:  As far as I can tell, the patient looks well.   HEENT:   EYES:  No nystagmus.   NEUROLOGICAL:  Nonfocal.     BALANCE/GAIT:  He has a wide-based, unsteady gait which I felt might be related to either B12 or 25-hydroxy vitamin D deficiency.  This seems to have gotten better.    ASSESSMENT/PLAN:                                          Dilantin toxicity with a history of status epilepticus.  I do not know that there is any major issue here.  I held the Dilantin and will resume at 200 b.i.d. starting tomorrow.  Recheck his level next week.           CPT CODE: 50932

## 2014-09-13 NOTE — Progress Notes (Signed)
Patient ID: Donald Bradford, male   DOB: 01/08/1951, 64 y.o.   MRN: [id number]

## 2014-10-04 ENCOUNTER — Non-Acute Institutional Stay (SKILLED_NURSING_FACILITY): Payer: Medicaid Other | Admitting: Internal Medicine

## 2014-10-04 DIAGNOSIS — C4440 Unspecified malignant neoplasm of skin of scalp and neck: Secondary | ICD-10-CM

## 2014-10-06 NOTE — Progress Notes (Signed)
Patient ID: Donald Bradford, male   DOB: Dec 20, 1950, 64 y.o.   MRN: [id number]               PROGRESS NOTE  DATE:  10/04/2014                 [facility]                       LEVEL OF CARE:   SNF   Acute Visit   CHIEF COMPLAINT:  Lesion on his left occiput.    HISTORY OF PRESENT ILLNESS:  This is a man whom I admitted in November after being admitted to [facility] with status epilepticus.  He has not had any further seizures.    Apparently, according to the wound care nurse, he had a large eschar over the left occiput on his skull.  Apparently, this came off or was removed and he has a cauliflower type mass underneath this.    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  He needs to see Dermatology.  This is quite a raised area here and will need biopsy, probably excision.     CPT CODE: 03709

## 2014-12-08 ENCOUNTER — Non-Acute Institutional Stay (SKILLED_NURSING_FACILITY): Payer: Medicaid Other | Admitting: Internal Medicine

## 2014-12-08 DIAGNOSIS — G3281 Cerebellar ataxia in diseases classified elsewhere: Secondary | ICD-10-CM | POA: Diagnosis not present

## 2014-12-08 DIAGNOSIS — F1020 Alcohol dependence, uncomplicated: Secondary | ICD-10-CM

## 2014-12-08 DIAGNOSIS — G312 Degeneration of nervous system due to alcohol: Secondary | ICD-10-CM

## 2014-12-08 DIAGNOSIS — G40909 Epilepsy, unspecified, not intractable, without status epilepticus: Secondary | ICD-10-CM | POA: Diagnosis not present
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
## 2014-12-15 NOTE — Progress Notes (Signed)
Patient ID: Donald Bradford, male   DOB: Jan 09, 1951, 64 y.o.   MRN: [id number]                PROGRESS NOTE  DATE:  12/08/2014            [facility]        LEVEL OF CARE:   SNF   Routine Visit                       CHIEF COMPLAINT:  Routine medical visit/review of medical issues.       HISTORY OF PRESENT ILLNESS:  This is a 64 year-old man whom I admitted to the building in December 2015.   He had been at [facility] with status epilepticus, felt secondary to alcohol abuse.   Nevertheless, he was loaded on Dilantin and then started on Keppra.  His hospital course was complicated by aspiration pneumonia from which he recovered.      PAST MEDICAL HISTORY/PROBLEM LIST:                    Alcohol abuse.    Seizures related to #1, including status epilepticus.    Destructive rheumatoid arthritis involving his hands, although he has not been on any specific treatment for this and, as far as I can see, this has largely been "burnt out".    SOCIAL HISTORY:         HOUSING:  The patient was living in [place] prior to this.   FUNCTIONAL STATUS:  I was never really clear about his functional status.   He has not left the facility.  Presumably, he did not have enough support to be discharged.      CURRENT MEDICATIONS:  Medication list is reviewed.    REVIEW OF SYSTEMS:    HEENT:  He denies headache.   NEUROLOGICAL:  No recent seizures.  He told me on admission that he had had seizures for three years, PTA.    CHEST/RESPIRATORY:  No cough.  No sputum.  No shortness of breath.   CARDIAC:  No chest pain.     GI:  No nausea, vomiting, or abdominal pain.    PHYSICAL EXAMINATION:   GENERAL APPEARANCE:  The patient looks quite well, although he looks older than his stated age.    CHEST/RESPIRATORY:  Clear air entry bilaterally.    CARDIOVASCULAR:   CARDIAC:  Heart sounds are normal.   NEUROLOGICAL:    BALANCE/GAIT:  He still has a wide-based, unsteady gait,  although this is better than when he came in.  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   He spends most of his days in a wheelchair, but he is quite functional, moving around the facility.     ASSESSMENT/PLAN:                   Status epilepticus.  I will check a Dilantin level.    Profound gait difficulties.  I think this is probably either midline cerebellar disease and/or peripheral neuropathy related to alcoholism.     Burnt out rheumatoid arthritis.  This has not really been an issue.    I will check a Dilantin level on him.    His vitamin D level will be checked.  He was low when he came in.  This could be stopped.    According to the [facility], he is still on a nicotine patch.  I think that can stop, as well as his thiamine.     CPT CODE: 19166

## 2014-12-18 NOTE — Discharge Summary (Signed)
PATIENT NAME:  Donald Bradford, Donald Bradford MR#:  [id number] DATE OF BIRTH:  Aug 17, 1951  DATE OF ADMISSION:  07/20/2014 DATE OF DISCHARGE:  08/03/2014  ADDENDUM:  The patient actually will be discharged, 08/03/2014. He was unable to be discharged due to the fact that we did not have a bed for him. On the morning of 12/07, he was found to have some lethargy.  He was given Ativan prior to this due to some agitation. He is actually awake and alert, and has not been agitated.  I had neurology come see the patient again.  Occasionally, Keppra can cause agitation, so we decreased the dose of Keppra from 1000 b.i.d. to 500 b.i.d. He will need this for 6 more days and then this will be stopped.  DISCHARGE MEDICATIONS:  So daily change in his medications: 1.  Keppra 500 mg p.o. b.i.d. x 6 days then stop 08/09/2014. She has 15.  2.  Nicotine 10 mg inhalation q. 2 hours p.r.n.  3.  Nicotine patch 7 mg per 24 hours.   DIET:  Diet has changed to regular diet with mechanical soft, thin liquids and strict aspiration precautions and Ensure t.i.d.   DISCHARGE PHYSICAL EXAMINATION: VITAL SIGNS: Temperature 97.9, pulse 68, respirations 20, blood pressure 142/84, 93% on 2 liters.  GENERAL: The patient is alert, oriented, disheveled.  CARDIOVASCULAR: Regular rate and rhythm. No murmurs, gallops or rubs. PMI is not displaced.  LUNGS: Clear to auscultation. No crackles, rales, rhonchi, or wheezing.  ABDOMEN:  Bowel sounds positive. Nontender, nondistended. No hepatosplenomegaly. No rebound or guarding.  EXTREMITIES: No clubbing or cyanosis.  He has severely bad rheumatoid arthritis. NEUROLOGIC:  Cranial nerves II-XII  are intact. There are no focal deficits.  Strength 4/5 strength bilaterally and symmetrically.   TIME SPENT ON DISCHARGE:  45 minutes.   ____________________________ [name]. [name], MD spm:DT D: 08/03/2014 11:05:00 ET T: 08/03/2014 11:30:54 ET JOB#: [id number]  cc: [name], MD, <Dictator> [name]  MD ELECTRONICALLY SIGNED 08/03/2014 14:09

## 2014-12-18 NOTE — Consult Note (Signed)
   Comments   I spoke with pt's brother and updated him on his status. Brother recognizes that pt's health has been poor for many years. Pt was living with a friend, Heath Lark, in his camper. I note that this living situation was the same as his previous hospitalization. Pt's brother is in agreement with current medical plan and hopes that he will be weanable from the ventilator. However, he had previously discussed pt's end of life goals and does not think patient would want to be prolonged on the ventilator or resuscitated in the event of cardiac arrest. Brother feels patient should be a DNR. Will change code status to reflect this. We talked about comfort measures in the event of failed extubation. Will follow pt's status.   Electronic Signatures: Brytani Voth, Kirt Boys (NP)  (Signed [phone] 16:48)  Authored: Palliative Care Phifer, Izora Gala (MD)  (Signed [phone] 19:58)  Authored: Palliative Care   Last Updated: 30-Nov-15 19:58 by Phifer, Izora Gala (MD)

## 2014-12-18 NOTE — Discharge Summary (Signed)
PATIENT NAME:  Donald Bradford, Donald Bradford MR#:  [id number] DATE OF BIRTH:  1951/08/19  DATE OF ADMISSION:  07/20/2014 DATE OF DISCHARGE:  07/30/2014  ADMISSION DIAGNOSES:  1.  Acute respiratory failure.  2.  Acute encephalopathy.  3.  Seizures.   DISCHARGE DIAGNOSES: 1.  Status epilepticus, resolved.  2.  Alcohol abuse.  3.  Hyponatremia. 4.  Acute encephalopathy.  5.  Acute respiratory failure with hypoxia.  6.  Aspiration pneumonia.  7.  Acute renal failure from acute tubular necrosis. 8.  Hypernatremia.   CONSULTATIONS:  1.  Palliative care. 2.  Dr. Mortimer Fries from pulmonary.  3.  Neurology.   LABORATORIES AT DISCHARGE: Sodium 146, potassium 3.5, chloride 113, bicarb 25, BUN 23, creatinine 1.09, glucose 98. Magnesium 1.6; this was repleted.   Dilantin 7.6 but corrected with  18as per pharmacy.   HOSPITAL COURSE: A 64 year old male who was admitted on 07/20/2014 with acute respiratory failure and seizures. For further details, please refer to the H and P.  1.  Epilepsy with status epilepticus. Likely due to alcohol abuse. He had an EEG that was concerning for ongoing seizures and epilepsy. His repeat EEG on 11/30 showed no seizure activity. He was loaded with Dilantin and started on Keppra. Neurology followed the patient while the patient was hospitalized. He no longer is having any active seizures, although we encouraged the patient to continue on Dilantin and Keppra. No driving for at least 6 months.  2.  Alcohol abuse with withdrawal. The patient was sedated on the vent for much of his withdrawal; however, he is not having any signs of withdrawal at this time.  3.  Hyponatremia, which improved.  4.  Acute encephalopathy due to postictal state with status epilepticus and alcohol withdrawal. Ammonia level was normal. His encephalopathy has much improved. He is back to his baseline mental status.  5.  Acute respiratory failure with hypoxia. The patient had increased work of breathing and was  intubated by pulmonary. He was extubated on 07/28/2014 and doing well without any acute respiratory compromise. He also had aspiration pneumonia, likely due to his seizures, and was treated for those with antibiotics.  6.  Aspiration pneumonia. The patient has completed a course of Zosyn. He was also on vancomycin. Blood cultures are negative.  7.  Hypotension. Initially the patient was on Levophed. The hypotension has resolved.  8.  Acute renal failure due to ATN secondary to hypotension and prolonged seizures, which is improved.  9.  Leukopenia. The patient has a history of leukopenia. His white blood cell count has stabilized here.   DISCHARGE MEDICATIONS: 1.  Keppra 1000 mg b.i.d.  2.  Dilantin 300 mg daily.  3.  Thiamine 100 mg daily for 7 days.   DISCHARGE DIET: Regular diet with Ensure 3 times a day, mechanical soft, thin liquids, strict aspiration precautions.   DISCHARGE INSTRUCTIONS: The should be discharged to SNF with physical therapy. The patient should follow up with MD at the facility.   The patient is stable for discharge.  DISCHARGE TIME: Approximately 40 minutes. ____________________________ Donald Bradford. [name], MD spm:sb D: 07/30/2014 13:05:46 ET T: 07/30/2014 13:29:16 ET JOB#: [id number]  cc: Donald Grieshop P. [name], MD, <Dictator> Donald Bradford Donald Kamps MD ELECTRONICALLY SIGNED 07/30/2014 14:04

## 2014-12-18 NOTE — Consult Note (Signed)
PATIENT NAME:  Donald Bradford, Donald Bradford MR#:  [id number] DATE OF BIRTH:  08-26-1951  DATE OF CONSULTATION:  07/21/2014  REFERRING PHYSICIAN:   CONSULTING PHYSICIAN:  Leotis Pain, MD  REASON FOR CONSULTATION: This is a 64 year old gentleman with a past medical history of chronic alcohol k, chronic with EtOH withdrawal seizures, traumatic brain injury, and intracerebral hemorrhage, presenting with seizure activity. The patient was on Dilantin prior, which he discontinued. On presentation, the patient was status post Ativan and started on Keppra. No further seizure activity. Patient apparently back to baseline. When questioned about his last drink, he states, "I wish I had it today".   REVIEW OF SYSTEMS: Unable to obtain, as the patient does appear to be confused.   PAST MEDICAL HISTORY: History of EtOH withdrawal seizures, traumatic brain injury, right temporal encephalomalacia, hepatitis C.   FAMILY HISTORY: Noncontributory.   ALLERGIES: No known drug allergies.   MEDICATIONS: The patient is not on any medications.  LABORATORY WORKUP: Imaging has been reviewed.   NEUROLOGIC EVALUATION: Mental Status: The patient is awake to his name, confused to current location, could not tell me the date or time. Cranial Nerves: Extraocular movements are intact. Facial sensation intact. Tongue is midline. Motor Strength: Generalized weakness, bilateral upper and lower extremities. Coordination: Intact. Sensation: Appears to be intact. Gait: Could not be assessed.   IMPRESSION: A 64 year old gentleman with chronic history of EtOH abuse, comes in with what is likely an alcohol withdrawal seizure. The patient has had multiple seizures in the past, was on Dilantin, which he had discontinued. Started on Keppra, and currently on Keppra 500 mg b.i.d.   PLAN: At this point the patient appears to be closely back to baseline. I will start discharge paperwork. From a neurological standpoint, I am not convinced the patient  needs antiepileptic medications, because I do not think he will be compliant with this. Specifically Keppra, which is fairly expensive, and I do not think he will be able to afford it unless we have case management on board. We could consider putting the patient back on Dilantin, but again, I do not think he will be compliant with it, like he was not last time. We will see if we can get the patient into an alcohol rehab facility.   No further imaging needed from a neurological standpoint. I do not think he needs an EEG.   Thank you. It was a pleasure seeing this patient.    ____________________________ Leotis Pain, MD yz:MT D: 07/21/2014 14:28:29 ET T: 07/21/2014 15:42:04 ET JOB#: [id number]  cc: Leotis Pain, MD, <Dictator> Leotis Pain MD ELECTRONICALLY SIGNED 07/26/2014 13:40

## 2014-12-18 NOTE — Discharge Summary (Signed)
PATIENT NAME:  Donald Bradford, Donald Bradford MR#:  [id number] DATE OF BIRTH:  1951-02-06  DATE OF ADMISSION:  07/20/2014 DATE OF DISCHARGE:    ADDENDUM:  The patient actually will be discharged, 08/03/2014. He was unable to be discharged due to the fact that we did not have a bed for him. On the morning of 12/07, he was found to have some lethargy.  He was given Ativan prior to this due to some agitation. He is actually awake and alert, and has not been agitated.  I had neurology come see the patient again.  Occasionally, Keppra can cause agitation, so we decreased the dose of Keppra from 1000 b.i.d. to 500 b.i.d. He will need this for 6 more days and then this will be stopped.  DISCHARGE MEDICATIONS:  So daily change in his medications: 1.  Keppra 500 mg p.o. b.i.d. x 6 days then stop 08/09/2014. She has 15.  2.  Nicotine 10 mg inhalation q. 2 hours p.r.n.  3.  Nicotine patch 7 mg per 24 hours.   DIET:  Diet has changed to regular diet with mechanical soft, thin liquids and strict aspiration precautions and Ensure t.i.d.   DISCHARGE PHYSICAL EXAMINATION: VITAL SIGNS: Temperature 97.9, pulse 68, respirations 20, blood pressure 142/84, 93% on 2 liters.  GENERAL: The patient is alert, oriented, disheveled.  CARDIOVASCULAR: Regular rate and rhythm. No murmurs, gallops or rubs. PMI is not displaced.  LUNGS: Clear to auscultation. No crackles, rales, rhonchi, or wheezing.  ABDOMEN:  Bowel sounds positive. Nontender, nondistended. No hepatosplenomegaly. No rebound or guarding.  EXTREMITIES: No clubbing or cyanosis.  He has severely bad rheumatoid arthritis. NEUROLOGIC:  Cranial nerves II-XII  are intact. There are no focal deficits.  Strength 4/5 strength bilaterally and symmetrically.   TIME SPENT ON DISCHARGE:  45 minutes.   ____________________________ [name]. [name], MD spm:DT D: 08/03/2014 11:05:09 ET T: 08/03/2014 11:30:54 ET JOB#: [id number]  cc: Charisa Twitty P. [name], MD, <Dictator>

## 2014-12-18 NOTE — H&P (Signed)
PATIENT NAME:  Donald Bradford, NAVES MR#:  [id number] DATE OF BIRTH:  1951/03/17  DATE OF ADMISSION:  07/20/2014  REFERRING PHYSICIAN:  Ahmed Prima, MD  PRIMARY CARE PHYSICIAN:  None.   CHIEF COMPLAINT:  Seizure.   HISTORY OF PRESENT ILLNESS:  This is a [name] with history of alcohol abuse, history of withdrawal seizures as well as seizure disorder, and traumatic brain injury, as well as intracerebral hemorrhage, presenting with seizure activity. The patient is unable to provide any meaningful information given current mental status after receiving doses of Ativan; however, he was awake enough to respond to questions earlier. He had a witnessed tonic-clonic seizure today by a friend, who called EMS. He had a repeat seizure with EMS and received Ativan and Keppra thus far in the Emergency Department with no further seizure activity. Once again, he is unable to provide any real meaningful information given current mental status.   REVIEW OF SYSTEMS:  Unable to obtain given the patient's mental status and medical condition.   PAST MEDICAL HISTORY:  Per documentation, seizure disorder given history of traumatic brain injury, as well as right temporal encephalomalacia, hepatitis C, and CVA, as well as alcohol abuse with a history of withdrawal seizures.   SOCIAL HISTORY:  Per documentation, continued tobacco use as well as alcohol use daily.   FAMILY HISTORY:  Unobtainable at this time given the patient's mental status and medical condition.  ALLERGIES:  No known drug allergies.   HOME MEDICATIONS:  None, though he was previously on Dilantin, last taken slightly over a year ago.   PHYSICAL EXAMINATION: VITAL SIGNS:  Temperature 100.6, heart rate 85, respirations 20, blood pressure 154/98, saturating 93% on room air. Weight is 54.4 kg, BMI 15.  GENERAL:  Disheveled Caucasian gentleman currently in minimal distress given mental status.  HEAD:  Normocephalic, atraumatic.  EYES:   Pupils are equal, round, and sluggishly reactive to light. Extraocular muscles are unable to be fully assessed at this time given mental status. No scleral icterus.  MOUTH:  Dry mucosal membranes. Dentition is poor. No abscess noted.  EARS, NOSE, AND THROAT:  Clear without exudates. No external lesions.  NECK:  Supple. No thyromegaly. No nodules. No JVD.  PULMONARY:  Clear to auscultation bilaterally without wheeze, rales, or rhonchi. No use of accessory muscles. Good respiratory effort.  CHEST:  Nontender to palpation.  CARDIOVASCULAR:  S1 and S2, regular rate and rhythm. No murmurs, rubs, or gallops. No edema. Pedal pulses are 2+ bilaterally.  GASTROINTESTINAL:  Soft, nontender, nondistended. No masses. Positive bowel sounds. No hepatosplenomegaly.  MUSCULOSKELETAL:  Has bilateral lower extremity edema, which is trace in the ankles, with associated erythema bilaterally as well. Passive range of motion is full in all extremities.  NEUROLOGICAL:  Unable to fully assess at this time given the patient's current mental status after he has received doses of Ativan.  SKIN:  Has bilateral erythema of the lower extremities with some ulcerations in-between the digits. No further lesions or ulcerations.  PSYCHIATRIC:  Unable to fully assess given the patient's mental status at this time post seizure and receiving Ativan.   LABORATORY AND RADIOLOGIC DATA:  CT of the head performed with no acute intracranial process. There is evidence of cerebral atrophy as well as noted encephalomalacia in the right temporal and parietal regions. Remainder of laboratory data:  Sodium 133, potassium 4.2, chloride 101, bicarbonate 24, BUN 28, creatinine 1.3, and glucose 93. LFTs:  Protein 7.5, albumin 3.3, bilirubin 0.2, alkaline  phosphatase 148, AST 42, and ALT 23. Troponin is less than 0.02. WBC is 4.4, hemoglobin 14.1, and platelets are 276,000.   ASSESSMENT AND PLAN:  A 64 year old Caucasian gentleman with history of alcohol  abuse, as well as seizure disorder and withdrawal seizures, presenting with seizure activity.   1.  Seizures, which were tonic-clonic. He was previously on Dilantin, last taken slightly greater than a year ago. He received Ativan and Keppra in the Emergency Department. We will continue with Keppra b.i.d. We will consult neurology as well as initiate CIWA given known alcohol abuse.  2.  Alcohol abuse. Initiate CIWA protocol. IV Ativan as required. He will also have telemetry to aid in monitoring the withdrawal process. 3.  Hyponatremia: IV fluid hydration and follow sodium levels.  4.  Venous thromboembolism prophylaxis with heparin subcutaneously.   CODE STATUS:  The patient is a full code.   TIME SPENT:  45 minutes.     ____________________________ Donald Mose. Hower, MD dkh:nb D: 07/20/2014 22:49:49 ET T: 07/20/2014 23:47:13 ET JOB#: [id number]  cc: Donald Mose. Hower, MD, <Dictator> Donald Woodfin Ganja MD ELECTRONICALLY SIGNED 07/28/2014 23:56

## 2014-12-19 NOTE — Consult Note (Signed)
Chief Complaint:   Subjective/Chief Complaint No very verbal . Lying in bed denies pain.   VITAL SIGNS/ANCILLARY NOTES: **Vital Signs.:   27-Jun-13 14:00   Vital Signs Type Routine   Temperature Source oral   Pulse Pulse 82   Pulse source per cardiac monitor   Respirations Respirations 10   Systolic BP Systolic BP 229   Diastolic BP (mmHg) Diastolic BP (mmHg) 68   Mean BP 79   Pulse Ox % Pulse Ox % 100   Oxygen Delivery 3L   Pulse Ox Heart Rate 82  *Intake and Output.:   Daily 27-Jun-13 07:00   Grand Totals Intake:  3926.32 Output:  2125    Net:  1801.32 24 Hr.:  1801.32   IV (Primary)      In:  1800   IV (Primary)      In:  273.82   IV (Primary)      In:  480   IV (Primary)      In:  960   IV (Secondary)      In:  412.5   Urine ml     Out:  2125   Length of Stay Totals Intake:  7989.21 Output:  2125    Net:  3546.52   Brief Assessment:   Cardiac Regular  murmur present  -- LE edema  -- JVD    Respiratory normal resp effort  clear BS    Gastrointestinal Normal    Gastrointestinal details normal Soft  Nontender  Nondistended   Lab Results: Hepatic:  27-Jun-13 05:11    SGOT (AST)  191   Alkaline Phosphatase 119   Albumin, Serum  1.6   Total Protein, Serum  5.4   Bilirubin, Total  1.5   SGPT (ALT) 68 (12-78 NOTE: NEW REFERENCE RANGE 07/20/2011)  TDMs:  27-Jun-13 15:57    Dilantin, Serum  5.6 (Result(s) reported on 21 Feb 2012 at 04:46PM.)  Lab:  27-Jun-13 08:40    pH (ABG)  7.37   PCO2 36   PO2  79   FiO2 28   Base Excess  -3.9   HCO3  20.8   O2 Saturation 96.5   Specimen Site (ABG) RT RADIAL   Patient Temp (ABG) 37.0 (Result(s) reported on 21 Feb 2012 at 08:46AM.)  Routine Chem:  27-Jun-13 05:11    Magnesium, Serum 2.0 (1.8-2.4 THERAPEUTIC RANGE: 4-7 mg/dL TOXIC: > 10 mg/dL  -----------------------)   Glucose, Serum  159   BUN  22   Creatinine (comp) 1.11   Sodium, Serum 136   Potassium, Serum  3.0   Chloride, Serum 103   CO2, Serum 21    Anion Gap 12   Osmolality (calc) 279   Calcium (Total), Serum  8.0   eGFR (African American) >60   eGFR (Non-African American) >60 (eGFR values <35m/min/1.73 m2 may be an indication of chronic kidney disease (CKD). Calculated eGFR is useful in patients with stable renal function. The eGFR calculation will not be reliable in acutely ill patients when serum creatinine is changing rapidly. It is not useful in  patients on dialysis. The eGFR calculation may not be applicable to patients at the low and high extremes of body sizes, pregnant women, and vegetarians.)   Result Comment cbc - SMEAR SCANNED  Result(s) reported on 21 Feb 2012 at 10:59AM.   Hemoglobin A1c ([facility] 5.5 (The American Diabetes Association recommends that a primary goal of therapy should be <7% and that physicians should reevaluate the treatment regimen in patients with HbA1c  values consistently >8%.)    15:57    Potassium, Serum  3.3 (Result(s) reported on 21 Feb 2012 at 04:30PM.)   Ammonia, Plasma < 25 (Result(s) reported on 21 Feb 2012 at 04:30PM.)    21:39    Potassium, Serum 3.7 (Result(s) reported on 21 Feb 2012 at 09:55PM.)  Routine Coag:  27-Jun-13 05:11    Prothrombin 14.5   INR 1.1 (INR reference interval applies to patients on anticoagulant therapy. A single INR therapeutic range for coumarins is not optimal for all indications; however, the suggested range for most indications is 2.0 - 3.0. Exceptions to the INR Reference Range may include: Prosthetic heart valves, acute myocardial infarction, prevention of myocardial infarction, and combinations of aspirin and anticoagulant. The need for a higher or lower target INR must be assessed individually. Reference: The Pharmacology and Management of the Vitamin K  antagonists: the seventh ACCP Conference on Antithrombotic and Thrombolytic Therapy. YBWLS.9373 Sept:126 (3suppl): [id number]. A HCT value >55% may artifactually increase the PT.  In one study,   the increase was an average of 25%. Reference:  "Effect on Routine and Special Coagulation Testing Values of Citrate Anticoagulant Adjustment in Patients with High HCT Values." American Journal of Clinical Pathology 2006;126:400-405.)  Routine Hem:  27-Jun-13 05:11    Neutrophil % 73.1   Lymphocyte % 25.0   Monocyte % 0.3   Eosinophil % 1.3   Basophil % 0.3   Neutrophil # 3.3   Lymphocyte # 1.1   Monocyte #  0.0   Eosinophil # 0.1   Basophil # 0.0   WBC (CBC) 4.5   Hemoglobin (CBC)  10.1   Platelet Count (CBC)  53   RBC (CBC)  3.56   Hematocrit (CBC)  29.8   MCV 84   MCH 28.4   MCHC 33.9   RDW  15.3   Radiology Results: XRay:    25-Jun-13 21:10, Chest Portable Single View   Chest Portable Single View    REASON FOR EXAM:    ams  COMMENTS:       PROCEDURE: DXR - DXR PORTABLE CHEST SINGLE VIEW  - Feb 19 2012  9:10PM     RESULT: Comparison is made to the previous examination dated 14 November 2010.    There is increased density in the left upper lobe concerning for   underlying pneumonia. Patchy density is present in the inferior right   upper lobe as well. The cardiac silhouette is normal. There is no   effusion or pneumothorax. The bony structures appear unremarkable.    IMPRESSION:   1. Left upperlobe and possibly right upper lobe pneumonia. Correlate     with clinical and laboratory data. Developing hilar malignancy is not   completely excluded. Followup to document clearing is recommended.    Dictation Site: 2          Verified By: Legrand Rams, M.D., MD    28-Jun-13 11:45, Chest Portable Single View   Chest Portable Single View    REASON FOR EXAM: Central line placement  COMMENTS:    PROCEDURE: DXR [id number] DXR PORTABLE SINGLE VIEW CHEST  February 22 2012    11:45 AM    RESULT:     Frontal view of the chest is performed. Comparison is made to a prior   study dated 02/19/2012.     The patient has taken a shallow inspiration.     A Dobbhoff  feeding tube is appreciated with the tip curled in the region  of the distal esophagus. Retraction and repositioning is recommended.  The patient has taken a shallow inspiration. Areas of increased density   project within the right and left lung base. There is thickening of the   interstitial markings. The cardiac silhouette is moderately enlarged. The   visualized bony skeleton is unremarkable.    IMPRESSION:      1.  Dobbhoff feeding tube tip curled in the region of the distal   esophagus and repositioning is recommended.  2.  Atelectasis versus infiltrate within the lung bases, right greater   than left. Small effusion versus chronic scarring cannot be excluded.   3.  Underlying component of pulmonary edema is also of diagnostic   consideration though the interstitial prominence is accentuated by the   patient's shallow inspiration. Repeat surveillance evaluation is   recommended. These findings were discussed Dr. Verdell Carmine, thepatient's     attending physician, at the time of the initial interpretation.     Thank you for this opportunity to contribute to the care of your patient.         Verified By: Mikki Santee, M.D., MD    28-Jun-13 11:49, Abdomen AP Only   Abdomen AP Only    REASON FOR EXAM:    dobb hoff placemenrt  COMMENTS:   Bedside (portable):Y    PROCEDURE: DXR - DXR ABDOMEN AP ONLY  - Feb 22 2012 11:49AM     RESULT:     Frontal view of the abdomen and pelvis is performed.     FINDINGS: Air is seen within moderatelydilated loops of large and small   bowel. Findings which may represent the distal portion of a Dobbhoff   feeding tube is appreciated along the superior aspect of the image. This   appears to be curled in the region of the distal esophagus. The   visualized bony skeleton is unremarkable.    IMPRESSION:      1. Findings which appear to reflect the sequela of an ileus. A distal   large bowel obstruction cannot be excluded. Repeat surveillance    evaluation is recommended.  2. Dobbhoff feeding tube as described above. These findings were   identified on a previous chest radiograph.      Thank you for this opportunity to contribute to the care of your patient.           Verified By: Mikki Santee, M.D., MD    28-Jun-13 12:37, Chest Portable Single View   Chest Portable Single View    REASON FOR EXAM:    central line placement  COMMENTS:       PROCEDURE: DXR - DXR PORTABLE CHEST SINGLE VIEW  - Feb 22 2012 12:37PM     RESULT:     Frontal view of the chest is performed. Comparison is made to a prior   study of same date, earlier time.    A left sided central venous catheter is appreciated with the tip   projecting in the region of the superior vena cava and right atrial   junction. A Dobbhoff feeding tube is identified with the tip curled in   the region of the distal esophagus.The patient has taken a shallow   inspiration. There is thickening of the interstitial markings. The     cardiac silhouette is moderately enlarged. The visualized bony skeleton   demonstrates no evidence of acute osseous abnormalities.    IMPRESSION:     1.  Dobbhoff catheter, repositioning is recommended.  2.  Central venous catheter which appears to be appropriately positioned.   3.  Findings likely reflecting a component of pulmonary edema. The   patient's floor nurse was informed of these findings at the time of the   initial interpretation.      Thank you for this opportunity to contribute to the care of your patient.         Verified By: Mikki Santee, M.D., MD    28-Jun-13 12:38, Abdomen AP Only   Abdomen AP Only    REASON FOR EXAM:    dobbhoff placement  COMMENTS:       PROCEDURE: DXR - DXR ABDOMEN AP ONLY  - Feb 22 2012 12:38PM     RESULT:     Frontal view of the abdomen is performed. Comparison is made to a prior   study of the same date, earlier time.    A Dobbhoff feeding tube is appreciated with the tip  projecting in the   region of the distal esophagus. Air is seen within multiple dilated loops   of large and small bowel. There appears to be slight decreased   conspicuity of bowel air when compared tothe previous study and this may   be secondary to technique.  IMPRESSION:      1.  Repositioning of the patient's Dobbhoff feeding tube.  2.  Findings again likely representing an ileus. Distal large bowel   obstruction cannot be excluded, if clinically warranted.      Thank you for this opportunity to contribute to the care of your patient.           Verified By: Mikki Santee, M.D., MD  [place]:    26-Jun-13 10:55, [place] Color Flow Doppler Low Extrem Bilat   [place] Color Flow Doppler Low Extrem Bilat    REASON FOR EXAM:    edema  COMMENTS:       PROCEDURE: [place]  - US DOPPLER LOW EXTR BILATERAL  - Feb 20 2012 10:55AM     RESULT: The patient was not cooperative with the examination. The study   was performed portably in the critical care unit.    DuplexDoppler interrogation of the deep venous system of both legs from   the inguinal to the popliteal region demonstrates the deep venous systems   are fully compressible throughout. The color Doppler and spectral Doppler   appearance is normal. There isnormal response to distal augmentation.   The color Doppler images show no filling defect.    IMPRESSION:    1. No evidence of DVT in either lower extremity.    Dictation Site: 2          Verified By: Sundra Aland, M.D., MD  Lab:    26-Jun-13 09:10, ABG   pH (ABG) 7.35   PCO2 25   PO2 141   FiO2 32   Base Excess -10.0   HCO3 13.8   O2 Saturation 101.3   O2 Device [place]   Specimen Site (ABG)    RT RADIAL   Specimen Type (ABG) ARTERIAL   Patient Temp (ABG) 37.0   Result(s) reported on 20 Feb 2012 at 09:18AM.    27-Jun-13 08:40, ABG   pH (ABG) 7.37   PCO2 36   PO2 79   FiO2 28   Base Excess -3.9   HCO3 20.8   O2 Saturation 96.5   Specimen Site (ABG)    RT RADIAL    Patient Temp (ABG) 37.0   Result(s) reported on 21 Feb 2012 at 08:46AM.    28-Jun-13 17:54, ABG   pH (ABG) 7.42   PCO2 31   PO2 65   FiO2 55   Base Excess -3.4   HCO3 20.1   O2 Saturation 93   O2 Device    VENTI MASK   Specimen Site (ABG)    RT RADIAL   Specimen Type (ABG) ARTERIAL   Patient Temp (ABG) 37.0   Result(s) reported on 22 Feb 2012 at 06:56PM.  Cardiology:    25-Jun-13 20:28, ED ECG   Ventricular Rate 126   Atrial Rate 126   P-R Interval 154   QRS Duration 84   QT 310   QTc 448   P Axis 77   R Axis 88   T Axis 76   ECG interpretation    Sinus tachycardia  Possible Left atrial enlargement  Septal infarct (cited on or before 01-Aug-2006)  Abnormal ECG  When compared with ECG of 11-Nov-2010 20:34,  Criteria for Lateral infarct are no longer Present  ----------unconfirmed----------  Confirmed by OVERREAD, NOT ([id number]), editor PEARSON, BARBARA ([id number]) on 02/20/2012 8:41:16 AM   ED ECG     26-Jun-13 09:29, Echo Doppler   Echo Doppler    Interpretation Summary    The left ventricle is grossly normal size. There is no thrombus. Left   ventricular systolic function is normal. Ejection Fraction = >55%.   There is normal left ventricular wall thickness. The left   ventricular wall motion is normal. The right ventricular systolic   function is normal.    Procedure:    A two-dimensional transthoracic echocardiogram with color flow and   Doppler was performed.    Portable echo in CCU.  Pt. contracted on R side cannot be postioned.    Left Ventricle    Left ventricular systolic function is normal.    Ejection Fraction = >55%.    The left ventricular wall motion is normal.    The left ventricle is grossly normal size.    There is no thrombus.    There is normal left ventricular wallthickness.    Right Ventricle    The right ventricle is grossly normal size.    There is normal right ventricular wall thickness.    The right ventricular systolic function is  normal.    Atria    The left atrial size is normal.    Right atrial size is normal.    Mitral Valve    The mitral valve leaflets appear thickened, but open well.    There is mild mitral regurgitation.    Tricuspid Valve    The tricuspid valve is not well visualized, but is grossly normal.    No tricuspid regurgitation.    Aortic Valve    The aortic valve is normal in structure and function.    No aortic regurgitation is present.    Pulmonic Valve    The pulmonic valve is not well seen, but is grossly normal.    There is no pulmonic valvular regurgitation.    Great Vessels    The aortic root is not well visualized but is probably normal size.    Pericardium/Pleural    There is no pleural effusion.    No pericardial effusion.    MMode 2D Measurements and Calculations    RVDd: 2.7 cm    IVSd: 0.64 cm    LVIDd: 3.0 cm   LVIDs: 2.2 cm    LVPWd: 0.88 cm    FS: 25 %  EF(Teich): 51 %    Ao root diam: 3.2 cm    LA dimension: 2.9 cm    LVOT diam: 2.1 cm    Doppler Measurements and Calculations    MV E point: 74 cm/sec    MV A point: 105 cm/sec    MV E/A: 0.70     MV dec time: 0.12 sec    Ao V2 max: 119 cm/sec    Ao max PG: 6.0 mmHg    AVA(V,D): 3.2 cm2    LV max PG: 5.0 mmHg    LV V1 max: 110 cm/sec    PA V2 max: 106 cm/sec    PA max PG: 4.0 mmHg    TR Max vel: 143 cm/sec    TR Max PG: 8.0 mmHg    RVSP: 13 mmHg    RAP systole: 5.0 mmHg    Reading Physician: Lujean Amel   Sonographer: Sherrie Sport  Interpreting Physician:  Lujean Amel,  electronically signed on   02-22-2012 13:10:46  Requesting Physician: Lujean Amel  CT:    25-Jun-13 20:49, CT Head Without Contrast   CT Head Without Contrast    REASON FOR EXAM:    ams  COMMENTS:       PROCEDURE: CT  - CT HEAD WITHOUT CONTRAST  - Feb 19 2012  8:49PM     RESULT: History: Altered mental status.    Comparison Study: Prior head CT of 11/11/2010.     Findings: Standard nonenhanced head CT  obtained. No mass. No   hydrocephalus. No hemorrhage. Diffuse atrophy present. Encephalomalacia   right temporal lobe. No acute bony abnormality. Mild mucosal thickening   sphenoid sinus. Mild mucosal thickening ethmoid sinuses. Soft tissue   density noted inthe external auditory canal most consistent with cerumen.    IMPRESSION:  Atrophy and chronic ischemic change. No acute abnormality.          Verified By: Osa Craver, M.D., MD    26-Jun-13 14:31, CT Chest Without Contrast   CT Chest Without Contrast    REASON FOR EXAM:    Hilar Mass. Multifocal pneumonia.  COMMENTS:       PROCEDURE: CT  - CT CHEST WITHOUT CONTRAST  - Feb 20 2012  2:31PM     RESULT:     Technique: Helical noncontrasted 3 mm sections were obtained from the   thoracic inlet through the lung bases.    Findings: Evaluation of the mediastinum and hilar regions and structures   demonstrate subcentimeter lymph nodes in the AP window, right hilar   region, precarinal and subcarinal regions. An area of increased density   projects in theleft paraspinous region along the superior segment left   lower lobe. Differential considerations are atelectasis versus     infiltrate. More ominous etiology such as a mass cannot be excluded. This   area measures approximately 5.68 x 5.65 cm in AP by transverse dimensions   measured on soft tissue windowing in image #35. There is associated   atelectasis and possibly infiltrate within this area. A small effusion is   identified within the lung bases. There is thickening of the interstitial   markings.    The visualized upper abdominal viscera demonstrate no gross abnormalities.    IMPRESSION:      1. Consolidated density in superior segment left lower lobe. Differential   considerations are atelectasis versus infiltrate. More ominous etiology   such as a mass cannot be excluded particularly in the absence of signs   and  symptoms of infectious pneumonitis. Surveillance  evaluation status     post appropriate therapeutic regimen is recommended. If CT reevaluation   is performed contrast administration is recommended if clinically   possible.  2. Small lymph nodes within the mediastinum and hilar regions.     Thank you for the opportunity to contribute to the care of your patient.           Verified By: Mikki Santee, M.D., MD   Assessment/Plan:  Invasive Device Daily Assessment of Necessity:   Does the patient currently have any of the following indwelling devices? none   Assessment/Plan:   Assessment IMP Elevated cardiac enzymes HTN AMS ETOH abbuse Cellulitis Abn Ekg Congestion    Plan PLAN ICU care Antibx Hydration Withdrawl precautions F/U enzymes Podiatry input Social service F/U ECHO I do not rec cardiac cath   Electronic Signatures: Yolonda Kida (MD)  (Signed 29-Jun-13 11:25)  Authored: Chief Complaint, VITAL SIGNS/ANCILLARY NOTES, Brief Assessment, Lab Results, Radiology Results, Assessment/Plan   Last Updated: 29-Jun-13 11:25 by Lujean Amel D (MD)

## 2014-12-19 NOTE — Discharge Summary (Signed)
PATIENT NAME:  Donald Bradford, Donald Bradford MR#:  [id number] DATE OF BIRTH:  1950/12/01  DATE OF ADMISSION:  02/20/2012 DATE OF DISCHARGE:  02/27/2012  PRIMARY CARE PHYSICIAN: None. The patient will need a new primary care physician after discharge from rehab. The patient is being discharged to [facility] for further rehab.   PRESENTING COMPLAINT: Altered mental status.   DISCHARGE DIAGNOSES:  1. Hypovolemic, septic shock.  Resolved. 2. Altered mental status suspected from metabolic encephalopathy due to hypoglycemia, dehydration, and alcohol withdrawal with sepsis.   3. History of seizure disorder, suspected from alcohol use and chronic encephalomalacia. The patient is on Keppra. No seizures noted in the hospital.  4. Elevated troponin secondary to demand ischemia from sepsis.  5. Relative hypotension. The patient is not on beta blockers or any other rate-blocking agents.  6. Bilateral upper lobe pneumonia, suspected aspiration in the setting of chronic alcoholism.  7. Pancytopenia that is chronic due to long-standing alcohol abuse.  8. Acute renal failure, improved with hydration.  9. Transaminitis related to alcohol abuse and also history of hepatitis C.  10. Severe protein calorie malnutrition secondary to chronic alcohol abuse, poor p.o. intake.  11. Right great toe infection with maggot infestation status post removal of maggots.  12. Hypokalemia, hyponatremia, resolved. Appears to be due to alcohol abuse and dehydration.  13. Acute mild rhabdomyolysis.  CODE STATUS: FULL CODE.   LABORATORY DATA: Magnesium 1.7. Phosphorus 2.9, potassium 3.9, calcium 7.4. Blood cultures negative in 48 hours. Prealbumin 6. SGOT 91, alkaline phosphatase 182. Albumin 1.5,  hemoglobin is 10.2,  platelet count 43, white count 3.3. PT-INR 14.8-1.1. Ammonia level was less than 25.   CT of the chest with and without contrast on admission showed consolidated density in the superior segment of the left lower lobe.  Differential atelectasis, infiltrate. More ominous etiology such as mass cannot be excluded. Surveillance CT followup is recommended. Echo Doppler showed ejection fraction of 55%, normal LV function. No thrombus. Normal RV function as well.   Urine drug screen at admission was positive for opiates. Cholesterol panel within normal limits. Hemoglobin A1c 5.5. Troponin borderline elevated at 0.22.    CONSULTATIONS:  1. Speech therapy.  2. Physical therapy.  3. Wound consultation.  4. Dr. Lucky Cowboy.  5. Cardiology consultation, Dr. Clayborn Bigness.  6. Pulmonary consultation, Dr. Mortimer Fries.  DISCHARGE MEDICATIONS: 1. Levaquin 500 mg p.o. daily for three more days to complete a total of 10 days of treatment for pneumonia.  2. Multivitamin p.o. daily.  3. Thiamine 50 mg daily.  4. Mag-Ox 400 mg daily.  5. Aspirin 81 mg daily.  6. Protonix 40 mg p.o. daily.  7. Tylenol 325 mg p.o. q. 6 p.r.n.  8. Keppra 500 mg p.o. twice a day.  9. Triple antibiotic to affected area right foot daily.   INSTRUCTIONS: 1. Physical therapy.  2. Aspiration precautions.  3. Follow speech therapy recommendations.  4. Dysphagia I pureed nectar thick diet with Ensure one can t.i.d.  5. Oxygen 1-2 liters per minute if needed.  6. DuoNebs every four hours p.r.n.  7. Patient will need primary care physician followup after discharge from rehab.   BRIEF SUMMARY OF HOSPITAL COURSE: Donald Bradford is a 64 year old Caucasian gentleman with history of chronic alcoholism, chronic pancytopenia, tobacco abuse, rheumatoid arthritis, and hepatitis C who comes in with:  1. Altered mental status. Appeared to be metabolic encephalopathy, multifactorial due to hypoglycemia, dehydration, plus or minus seizures, alcohol withdrawal, sepsis, and mild hyponatremia. The patient was started  on IV fluids, kept n.p.o. initially, continued with IV Keppra. His urine drug screen was positive for opiates. TSH was normal. The patient's mental status improved  remarkably after a few days of the patient being very sleepy and being lethargic. He is answering questions appropriately although he sometimes has pleasant confusion. He seems to be at baseline.  2. Sepsis/septic shock secondary to pneumonia. The patient's initial two out of two bottles grew gram-positive cocci which appears to be coag-negative staph. Repeat blood cultures from 06/30 have been negative. The patient initially was continued on Zosyn, vancomycin, and Zithromax. However, antibiotics have been narrowed down to Levaquin p.o. daily for three more days to complete a 10-day course for pneumonia. The patient will need a repeat CT scan/chest x-ray and followup with primary care physician to ensure clearing of pneumonia. White count remained stable. No more fever. The patient's blood pressure remained relatively stable.  3. Hyponatremia, hypokalemia related to dehydration, alcohol abuse. Improved.  4. Severe protein calorie malnutrition secondary to chronic alcohol abuse. The patient initially would not allow NG tube placement. Hence TPN was started. Once the patient became more alert and oriented and was able to take an oral diet  TPN was discontinued.  5. Acute renal failure. Appears prerenal secondary to dehydration, improved with IV fluids.  6. Transaminitis likely alcohol-induced with history of hepatitis C. Remained stable.  7. Bilateral upper lobe pneumonia with left lower lobe pneumonia as well in the setting of chronic alcoholism. Suspected aspiration. The patient was seen by speech therapist who recommended above indicated diet, which the patient tolerated well. The patient will finish up antibiotics as an outpatient. Follow up with primary care physician to get CT of the chest or chest x-ray to ensure clearing of pneumonia.  8. Elevated troponin. Appeared to be due to demand ischemia in the setting of sepsis. No true ACS per cardiology, seen by Dr. Clayborn Bigness. The patient's echo showed ejection  fraction of 55% with no wall motion abnormality and normal LV function.  9. CODE STATUS: Remained a FULL CODE.   The patient will be discharged to [facility] for rehab.         TIME SPENT: 40 minutes.     ____________________________ Hart Rochester Posey Pronto, MD sap:bjt D: 02/27/2012 11:00:22 ET T: 02/27/2012 11:29:11 ET JOB#: [id number]  cc: Baelyn Doring A. Posey Pronto, MD, <Dictator> Dwayne D. Clayborn Bigness, MD Algernon Huxley, MD Mariane Duval, MD Ilda Basset MD ELECTRONICALLY SIGNED 02/28/2012 14:10

## 2014-12-19 NOTE — Consult Note (Signed)
PATIENT NAME:  Donald Bradford, Donald Bradford MR#:  [id number] DATE OF BIRTH:  11/18/1950  DATE OF CONSULTATION:  02/20/2012  REFERRING PHYSICIAN:   CONSULTING PHYSICIAN:  Sharlotte Alamo, DPM  REASON FOR CONSULTATION: This is a 64 year old male recently admitted to the critical care unit who has a loose toenail on his right great toe. The patient is unable to give any history about the toe.   PAST MEDICAL HISTORY:  1. Alcohol abuse with history of withdrawal and delirium tremens on last admission.  2. 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  3. Tobacco abuse.  4. Pancytopenia.  5. Chronic hepatitis C.  6. Cerebrovascular accident with infarct. 7. Eczema. 8. Chronic obstructive pulmonary disease.   PAST SURGICAL HISTORY:  1. Left parietal craniotomy.  2. Distal sternal scar on chest due to removal of pericardial fluid.   MEDICATIONS: Keppra 500 mg twice a day, but doubtful that he has been compliant.   ALLERGIES: No known drug allergies.   SOCIAL HISTORY: He smokes 1 to 1-1/2 packs of cigarettes per day. Significant history of alcohol abuse.   FAMILY HISTORY: Unknown.   REVIEW OF SYSTEMS: Unable to provide.   PHYSICAL EXAMINATION:   VASCULAR: Pulses appear to be intact bilateral.   NEUROLOGICAL: Epicritic sensations appear grossly intact.   INTEGUMENT: Skin is warm, dry, and somewhat atrophic. Toenails are severely elongated curving underneath the tips of the toes. The right hallux nail is almost completely avulsed. Upon complete avulsion, there is a granular nail bed with a significant colony of maggot infestation. No sign of bacterial infection.   MUSCULOSKELETAL: Unremarkable.   IMPRESSION:  1. Paronychia with traumatic nail avulsion, right hallux.  2. Severe elongated onychomycosis.   PLAN: Complete avulsion of the right hallux nail. Polysporin and a bandage applied. I debrided the other toenails as a courtesy. Orders given for daily wound care. Should not need follow-up on discharge as it  looks relatively clean.  ____________________________ Sharlotte Alamo, DPM tc:slb D: 02/20/2012 13:19:59 ET T: 02/20/2012 15:40:22 ET JOB#: [id number]  cc: Sharlotte Alamo, DPM, <Dictator> Hades Mathew DPM ELECTRONICALLY SIGNED 03/12/2012 7:42

## 2014-12-19 NOTE — Consult Note (Signed)
PATIENT NAME:  Donald Bradford, Donald Bradford MR#:  [id number] DATE OF BIRTH:  [birth year]  DATE OF CONSULTATION:  02/20/2012  REFERRING PHYSICIAN:   Alounthith Phichith, MD  CONSULTING PHYSICIAN:  Florella Mcneese D. Melodye Swor, MD  INDICATION: Elevated troponin, possible non-Q-wave myocardial infarction with altered mental status   HISTORY OF PRESENT ILLNESS: The patient is a 64 year old male with a long history of alcohol abuse, tobacco abuse, alcohol withdrawal seizures, traumatic brain injury in the past. He presented with altered mental status, weakness, fatigue, altered speech. He was brought in by EMS by a neighbor and found to be on the ground. When he was seen by EMS, he was hypoglycemic, blood sugar in the 30s, and continued to be hypoglycemic.   REVIEW OF SYSTEMS: No blackout spells, no syncope. No nausea or vomiting. No fever, no chills, no sweats. No weight loss or weight gain. No hematemesis. No bright red blood per rectum.   PAST MEDICAL HISTORY:  1. Alcohol abuse.  2. Tobacco abuse.  3. Pancytopenia.  4. Hepatitis C. 5. Cerebrovascular accident. 6. Traumatic brain injury. 7. Chronic obstructive pulmonary disease. 8. Seizure disorder.   PAST SURGICAL HISTORY:  1. Left parietal craniotomy for intracranial hemorrhage.  2. Distal sternal scar from the chest probably due to pericardial fluid.   MEDICATIONS: Keppra 500 mg twice a day, probably noncompliant.   FAMILY HISTORY: Unobtainable.   SOCIAL HISTORY: Smoking, alcohol abuse, probably homeless.  PHYSICAL EXAMINATION:  VITAL SIGNS: Blood pressure 101/57, pulse 110, respiratory rate 16, afebrile.   HEENT: Normocephalic, atraumatic. Pupils are equal, round and reactive to light.    NECK: Supple. No significant jugular venous distention, bruits, or adenopathy.   LUNGS: Clear to auscultation and percussion. No significant wheeze, rhonchi, or rale.   HEART: Tachycardia. Systolic ejection murmur in the left sternal border. PMI is nondisplaced.    ABDOMEN: Exam is benign. Positive bowel sounds. No rebound, guarding, or tenderness.   EXTREMITIES: No cyanosis, clubbing, or edema.   NEUROLOGIC: Grossly intact.   SKIN: Exam is normal.   LABORATORY, DIAGNOSTIC AND RADIOLOGICAL DATA:  CT of the head: Atrophy, chronic changes.  CK 981, MB 10, white count 2.6, hemoglobin and hematocrit 11.9 and 36, platelet count 84. BUN 17, creatinine 1.47, sodium 127, potassium 3.1.  LFTs: AST 187, ALT 53, alcohol level 3.0.  Troponin 0.17, TSH 2.09.  EKG: Sinus tachycardia 126, nonspecific ST-T wave changes.   ASSESSMENT:  1. Altered mental status. 2. Elevated troponin, possible non-Q-wave myocardial infarction.  3. Chronic obstructive pulmonary disease.  4. Pancytopenia.  5. Elevated transaminases. 6. Acute renal insufficiency.  7. Organic brain syndrome. 8. Seizure disorder.     PLAN:  1. Agree with admit, place on telemetry. Low-dose anticoagulation even though he is thrombocytopenic.  2. Follow-up EKG.  3. Follow-up echocardiogram. 4. Follow-up troponins and CKs, watch for withdrawal seizures.  5. I advised the patient to quit smoking.  6. Inhalers for chronic obstructive pulmonary disease.  7. I suspect his elevated enzymes are probably related to dehydration and renal insufficiency and not directly to a cardiac event. This may be related to rhabdomyolysis as well, so hydration and bicarbonate may be necessary.   We will continue to follow the patient conservatively from a cardiac standpoint.   ____________________________ Loran Senters. Clayborn Bigness, MD ddc:cbb D: 02/20/2012 12:35:03 ET T: 02/20/2012 15:08:35 ET JOB#: [id number]  cc: Gorje Iyer D. Clayborn Bigness, MD, <Dictator> Yolonda Kida MD ELECTRONICALLY SIGNED 03/18/2012 9:14

## 2014-12-19 NOTE — Consult Note (Signed)
Patient with maggot infestation of right great toe.  Nail debridement by Podiatry.  Malodorous, doesn't appear painful.  Rest of toe ok.  Foot warm.  PT pulse 1+, DP pulse 1+.  Can do angio if need be for work up.  No clear indication for amputation.  Maggots are generally not bad for the tissue, and would not remove toe for this reason alone.  Electronic Signatures: Algernon Huxley (MD)  (Signed on 27-Jun-13 17:44)  Authored  Last Updated: 27-Jun-13 17:44 by Algernon Huxley (MD)
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
## 2015-01-25 DIAGNOSIS — I69951 Hemiplegia and hemiparesis following unspecified cerebrovascular disease affecting right dominant side: Secondary | ICD-10-CM | POA: Diagnosis not present

## 2015-01-25 DIAGNOSIS — J449 Chronic obstructive pulmonary disease, unspecified: Secondary | ICD-10-CM | POA: Diagnosis not present

## 2015-01-25 DIAGNOSIS — B182 Chronic viral hepatitis C: Secondary | ICD-10-CM | POA: Diagnosis not present

## 2015-02-06 DIAGNOSIS — J449 Chronic obstructive pulmonary disease, unspecified: Secondary | ICD-10-CM | POA: Diagnosis not present

## 2015-03-18 DIAGNOSIS — J449 Chronic obstructive pulmonary disease, unspecified: Secondary | ICD-10-CM | POA: Diagnosis not present

## 2015-03-18 DIAGNOSIS — Z87820 Personal history of traumatic brain injury: Secondary | ICD-10-CM | POA: Diagnosis not present

## 2015-03-18 DIAGNOSIS — M059 Rheumatoid arthritis with rheumatoid factor, unspecified: Secondary | ICD-10-CM | POA: Diagnosis not present

## 2015-03-18 DIAGNOSIS — B1920 Unspecified viral hepatitis C without hepatic coma: Secondary | ICD-10-CM | POA: Diagnosis not present

## 2015-04-21 DIAGNOSIS — Z87820 Personal history of traumatic brain injury: Secondary | ICD-10-CM | POA: Diagnosis not present

## 2015-04-21 DIAGNOSIS — J449 Chronic obstructive pulmonary disease, unspecified: Secondary | ICD-10-CM | POA: Diagnosis not present

## 2015-05-12 DIAGNOSIS — G40301 Generalized idiopathic epilepsy and epileptic syndromes, not intractable, with status epilepticus: Secondary | ICD-10-CM | POA: Diagnosis not present

## 2015-05-12 DIAGNOSIS — Z872 Personal history of diseases of the skin and subcutaneous tissue: Secondary | ICD-10-CM | POA: Diagnosis not present

## 2015-05-12 DIAGNOSIS — J449 Chronic obstructive pulmonary disease, unspecified: Secondary | ICD-10-CM | POA: Diagnosis not present

## 2015-05-12 DIAGNOSIS — B1920 Unspecified viral hepatitis C without hepatic coma: Secondary | ICD-10-CM | POA: Diagnosis not present

## 2015-06-09 DIAGNOSIS — J449 Chronic obstructive pulmonary disease, unspecified: Secondary | ICD-10-CM | POA: Diagnosis not present

## 2015-06-09 DIAGNOSIS — I619 Nontraumatic intracerebral hemorrhage, unspecified: Secondary | ICD-10-CM | POA: Diagnosis not present

## 2015-07-15 DIAGNOSIS — J449 Chronic obstructive pulmonary disease, unspecified: Secondary | ICD-10-CM | POA: Diagnosis not present

## 2015-07-15 DIAGNOSIS — I679 Cerebrovascular disease, unspecified: Secondary | ICD-10-CM | POA: Diagnosis not present

## 2015-08-12 DIAGNOSIS — F39 Unspecified mood [affective] disorder: Secondary | ICD-10-CM | POA: Diagnosis not present

## 2015-08-12 DIAGNOSIS — J449 Chronic obstructive pulmonary disease, unspecified: Secondary | ICD-10-CM | POA: Diagnosis not present

## 2015-09-09 DIAGNOSIS — S72001A Fracture of unspecified part of neck of right femur, initial encounter for closed fracture: Secondary | ICD-10-CM | POA: Diagnosis not present

## 2015-09-09 DIAGNOSIS — J189 Pneumonia, unspecified organism: Secondary | ICD-10-CM | POA: Diagnosis not present

## 2015-09-09 DIAGNOSIS — M069 Rheumatoid arthritis, unspecified: Secondary | ICD-10-CM | POA: Diagnosis not present

## 2015-09-09 DIAGNOSIS — J9621 Acute and chronic respiratory failure with hypoxia: Secondary | ICD-10-CM | POA: Diagnosis not present

## 2015-10-13 DIAGNOSIS — J9621 Acute and chronic respiratory failure with hypoxia: Secondary | ICD-10-CM | POA: Diagnosis not present

## 2015-10-13 DIAGNOSIS — S72001A Fracture of unspecified part of neck of right femur, initial encounter for closed fracture: Secondary | ICD-10-CM | POA: Diagnosis not present

## 2015-10-13 DIAGNOSIS — J189 Pneumonia, unspecified organism: Secondary | ICD-10-CM | POA: Diagnosis not present

## 2015-11-11 DIAGNOSIS — L03115 Cellulitis of right lower limb: Secondary | ICD-10-CM | POA: Diagnosis not present

## 2015-12-15 DIAGNOSIS — J449 Chronic obstructive pulmonary disease, unspecified: Secondary | ICD-10-CM | POA: Diagnosis not present

## 2016-01-12 DIAGNOSIS — I69351 Hemiplegia and hemiparesis following cerebral infarction affecting right dominant side: Secondary | ICD-10-CM | POA: Diagnosis not present

## 2016-01-12 DIAGNOSIS — E440 Moderate protein-calorie malnutrition: Secondary | ICD-10-CM | POA: Diagnosis not present

## 2016-01-12 DIAGNOSIS — J449 Chronic obstructive pulmonary disease, unspecified: Secondary | ICD-10-CM | POA: Diagnosis not present

## 2016-01-19 IMAGING — CT CT HEAD WITHOUT CONTRAST
2 series · 13 of 30 positions shown, 15 images · non-contrast
Comparison: 04/04/2014

CLINICAL DATA: Seizures, slurred speech

EXAM:
CT HEAD WITHOUT CONTRAST
TECHNIQUE: Contiguous axial images were obtained from the base of the skull
through the vertex without intravenous contrast.

[Series 2: head wo · axial · 0.45mm/px · z∈[-52,+61]mm · 5 of 39 slices shown, 7 images]
[im 7/39  brain]
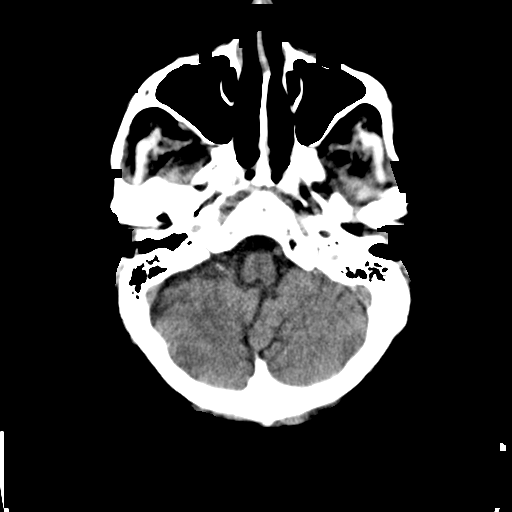
[im 7/39  bone]
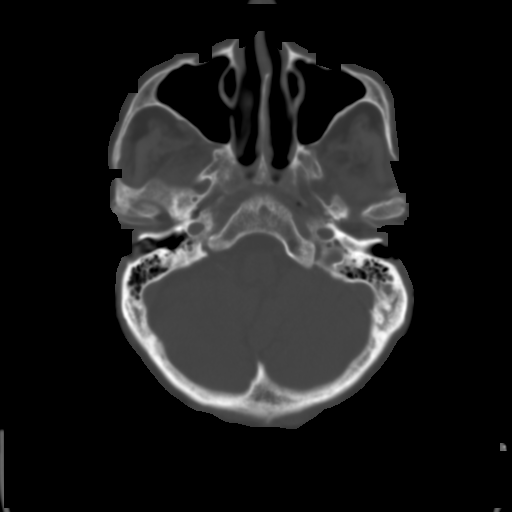
[im 13/39  brain]
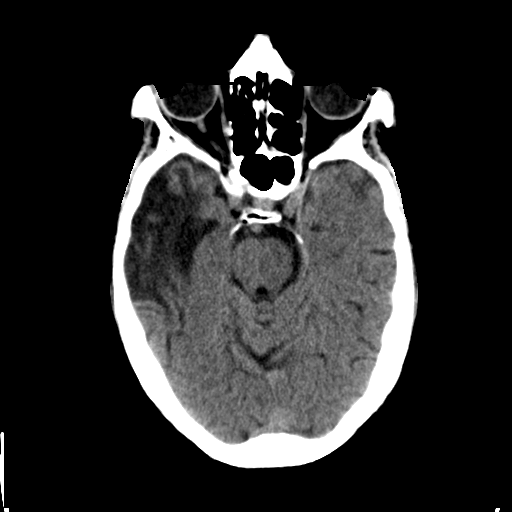
[im 20/39  brain]
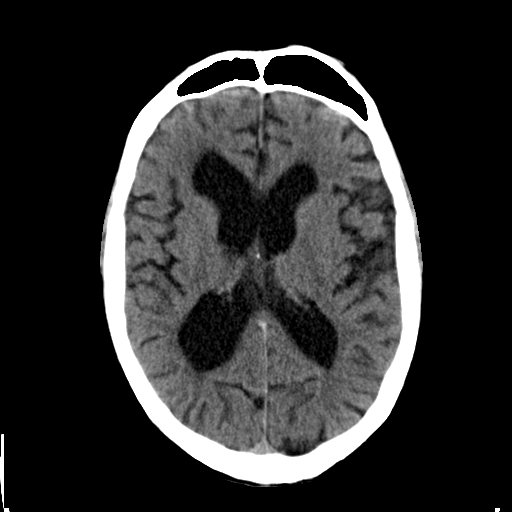
[im 26/39  brain]
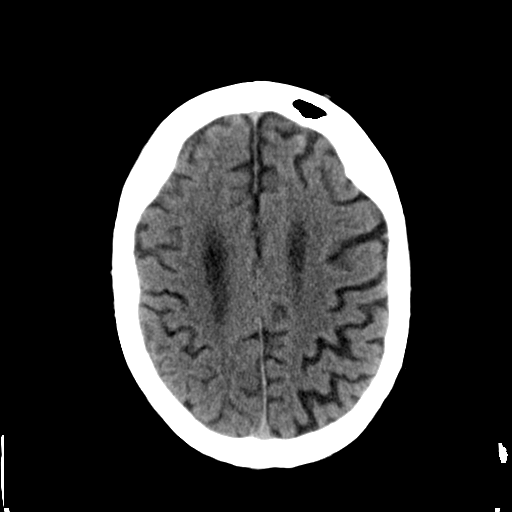
[im 32/39  brain]
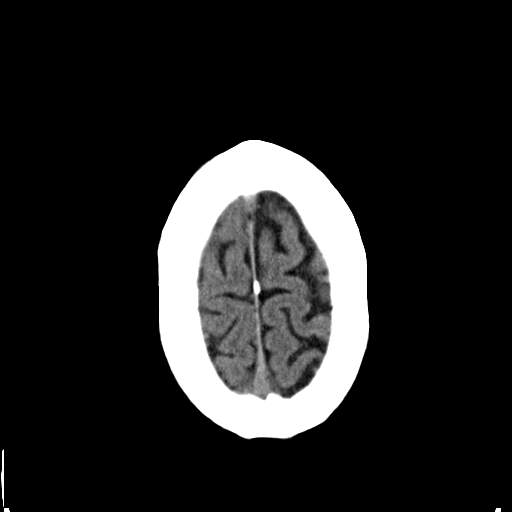
[im 32/39  bone]
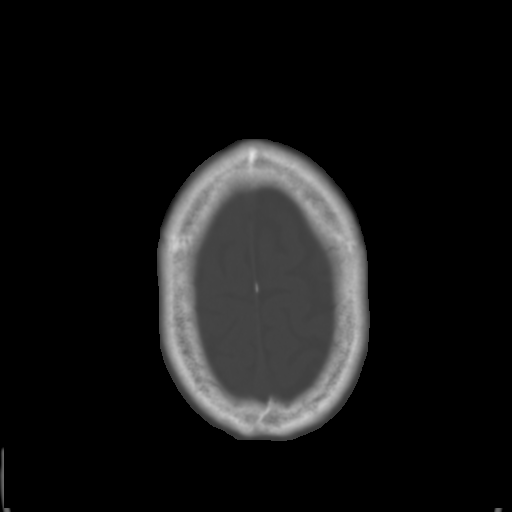

[Series 3: head bone · axial · 0.45mm/px · z∈[-65,+82]mm · 8 of 120 slices shown]
[im 11/120  bone]
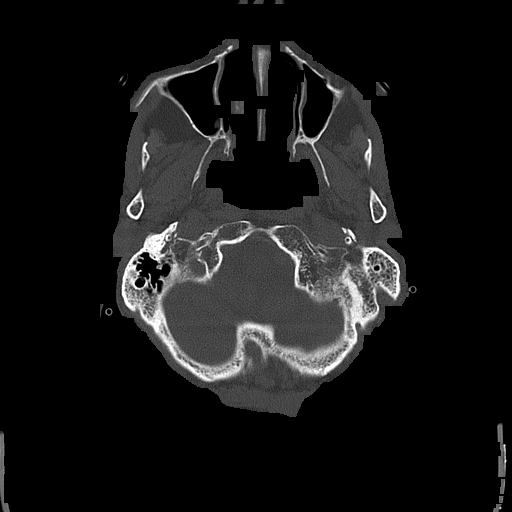
[im 22/120  bone]
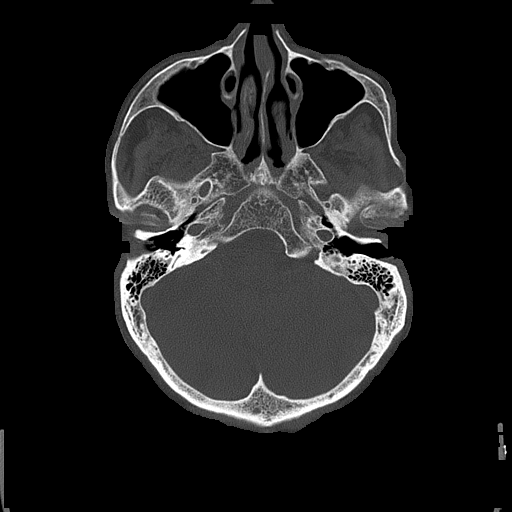
[im 38/120  bone]
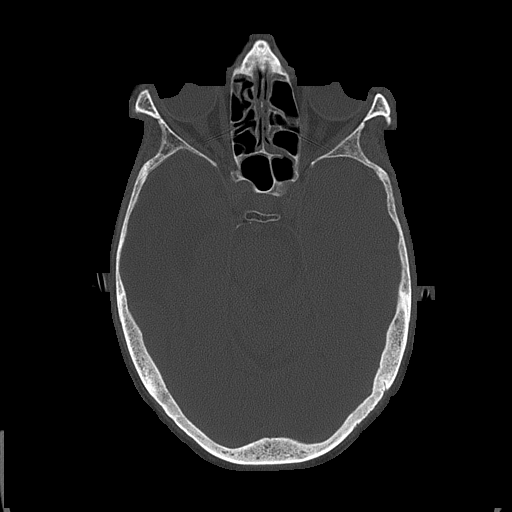
[im 55/120  bone]
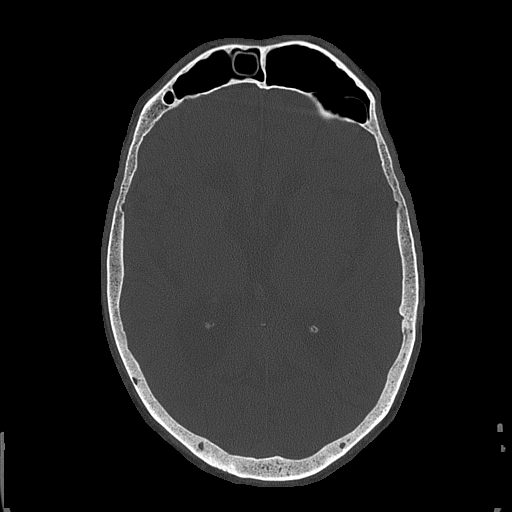
[im 65/120  bone]
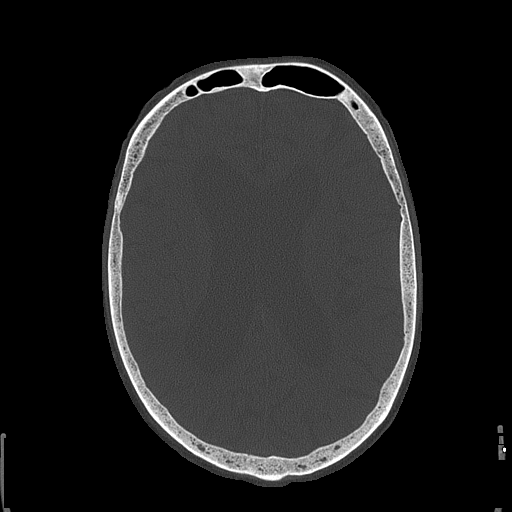
[im 82/120  bone]
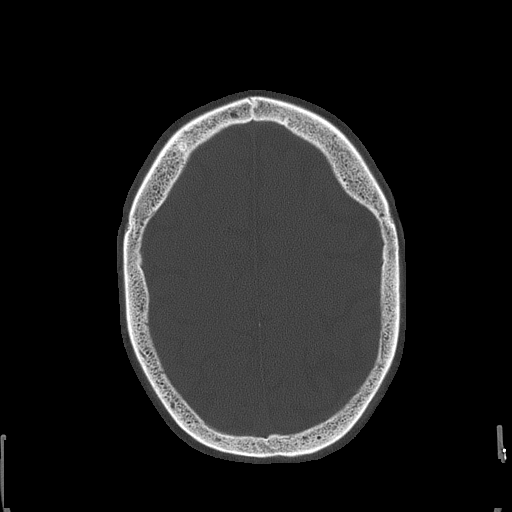
[im 98/120  bone]
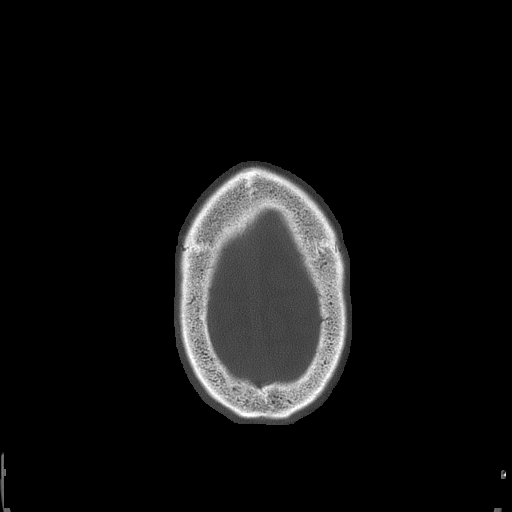
[im 109/120  bone]
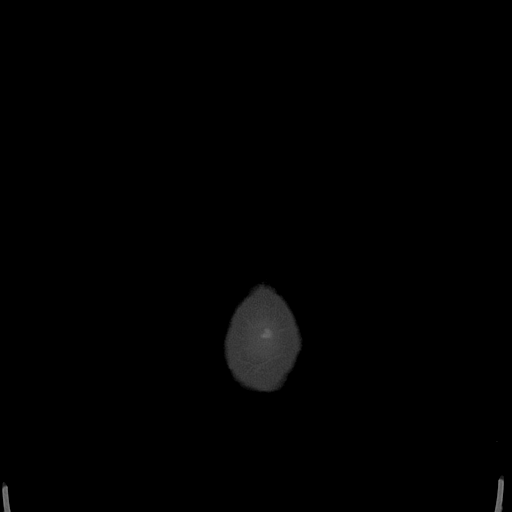

[13 of 30 positions shown; findings below may reference images not displayed]

FINDINGS: No skull fracture is noted. Paranasal sinuses and mastoid air cells
are unremarkable. No intracranial hemorrhage, mass effect or midline
shift. Stable atrophy and chronic white matter disease. Ventricular
size is stable from prior exam. Again noted encephalomalacia in
right temporal and parietal lobe consistent with prior infarct. No
definite acute cortical infarction. No mass lesion is noted on this
unenhanced scan.
IMPRESSION: No acute intracranial abnormality. Stable cerebral atrophy and
chronic white matter disease. Again noted encephalomalacia in right
temporal and parietal lobe from prior infarct. No definite acute
cortical infarction.

## 2016-01-20 IMAGING — CR DG CHEST 1V PORT
1 series · 1 of 1 positions shown · non-contrast
Comparison: 07/20/2014

CLINICAL DATA: Cough, history of seizures, shortness of Breath

EXAM:
PORTABLE CHEST - 1 VIEW

[ap]
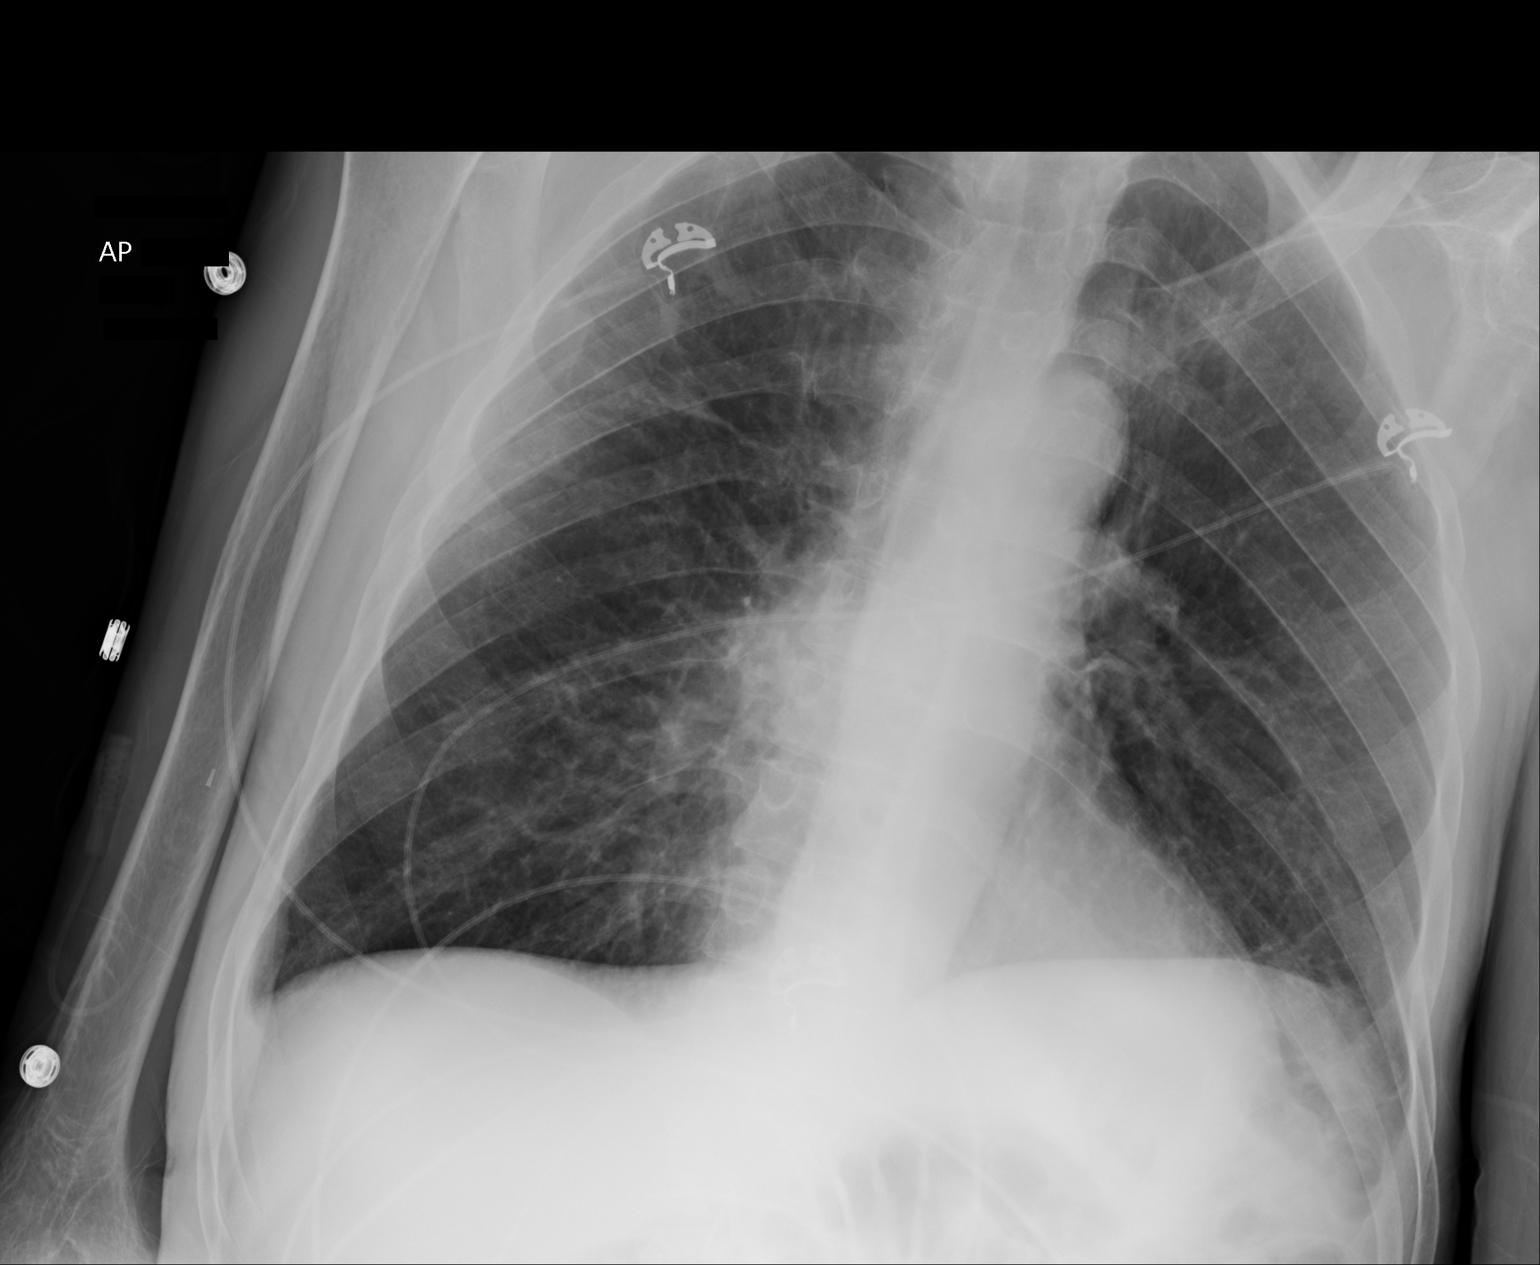

[1 of 1 positions shown; findings below may reference images not displayed]

FINDINGS: Cardiomediastinal silhouette is stable. No pulmonary edema. There is
streaky right upper lobe peripheral atelectasis or infiltrate.
IMPRESSION: No pulmonary edema. Streaky right upper lobe peripheral atelectasis
or infiltrate.

## 2016-01-21 IMAGING — CR DG ABDOMEN 1V
1 series · 1 of 1 positions shown · non-contrast
Comparison: Radiograph 02/22/2012

CLINICAL DATA: OG tube placement

EXAM:
ABDOMEN - 1 VIEW

[ap]
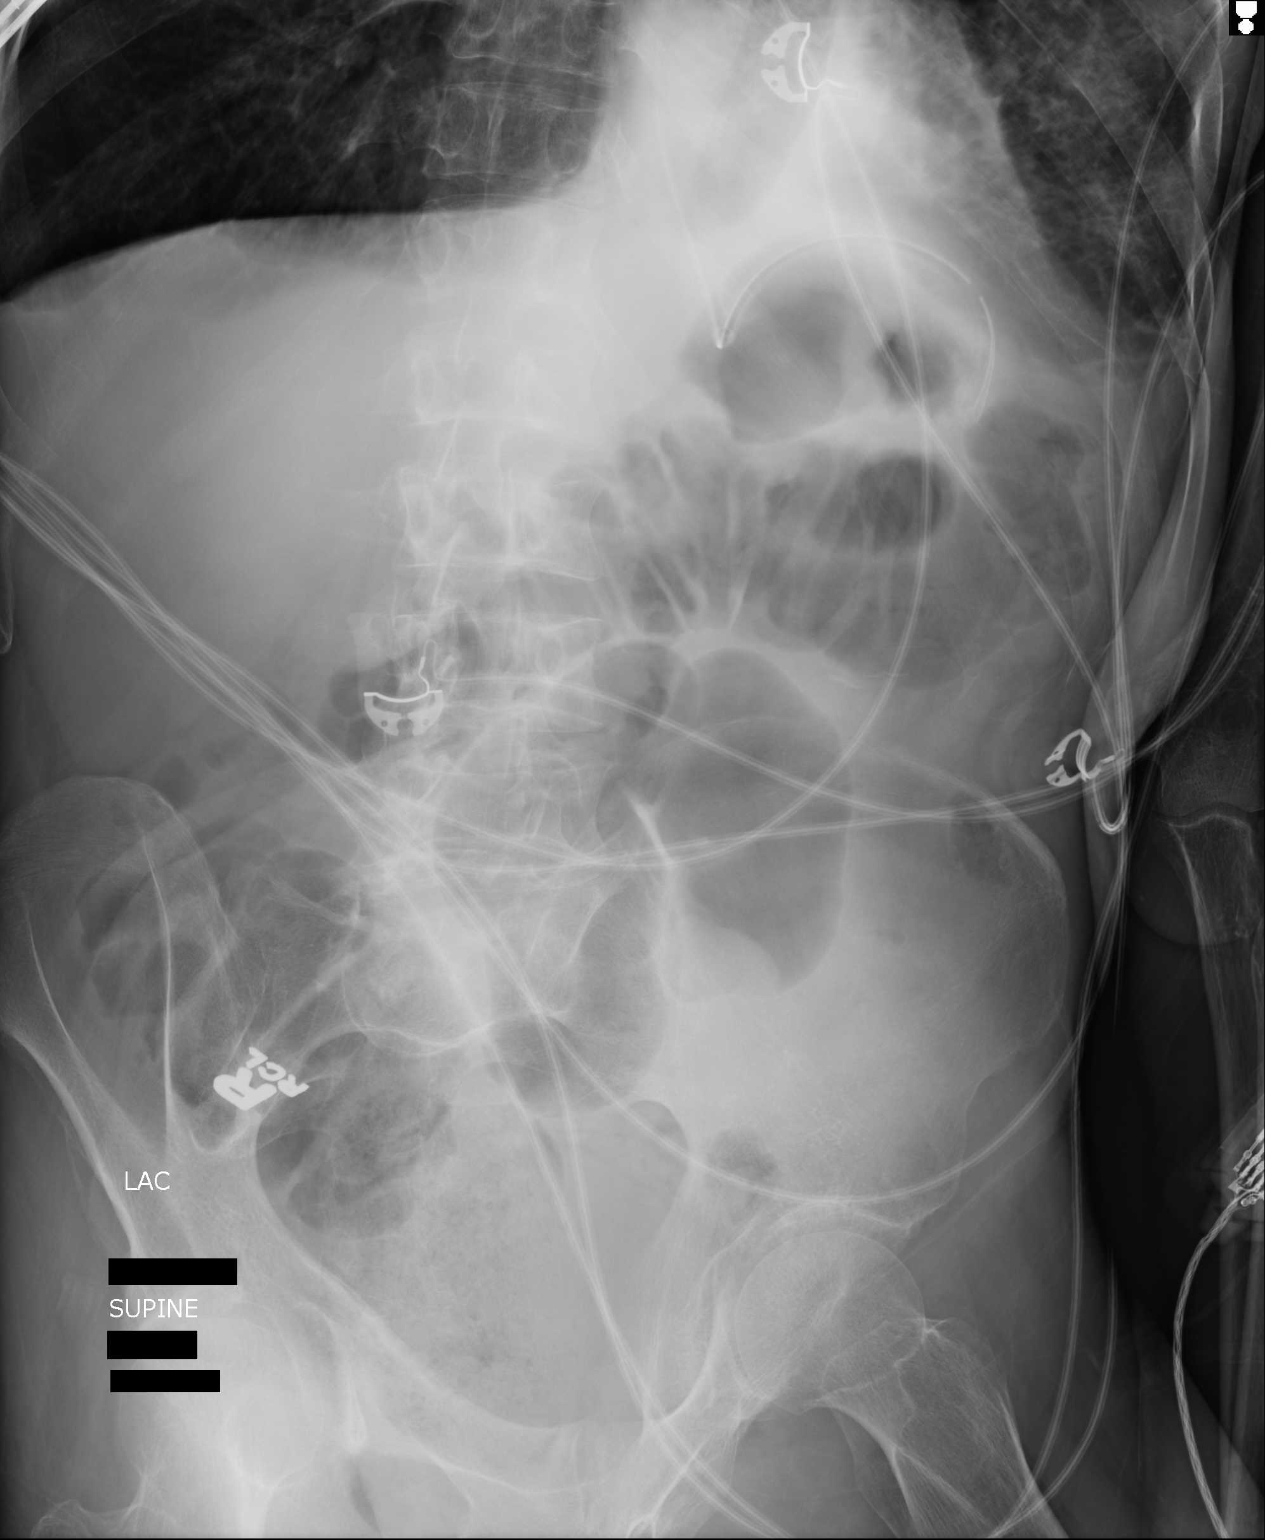

[1 of 1 positions shown; findings below may reference images not displayed]

FINDINGS: NG tube with tip in the gastric fundus. Decreased gas distention of
the small bowel centrally. Gas and stool present throughout the
colon. Large volume of stool in the rectum.
IMPRESSION: 1. OG tube in stomach.
2. Decrease in gaseous distension of the small bowel.

## 2016-02-10 DIAGNOSIS — J449 Chronic obstructive pulmonary disease, unspecified: Secondary | ICD-10-CM | POA: Diagnosis not present

## 2016-02-10 DIAGNOSIS — E440 Moderate protein-calorie malnutrition: Secondary | ICD-10-CM | POA: Diagnosis not present

## 2016-02-10 DIAGNOSIS — I69351 Hemiplegia and hemiparesis following cerebral infarction affecting right dominant side: Secondary | ICD-10-CM | POA: Diagnosis not present

## 2016-03-23 DIAGNOSIS — E440 Moderate protein-calorie malnutrition: Secondary | ICD-10-CM | POA: Diagnosis not present

## 2016-03-23 DIAGNOSIS — J449 Chronic obstructive pulmonary disease, unspecified: Secondary | ICD-10-CM | POA: Diagnosis not present

## 2016-03-27 DIAGNOSIS — B351 Tinea unguium: Secondary | ICD-10-CM | POA: Diagnosis not present

## 2016-03-27 DIAGNOSIS — I70203 Unspecified atherosclerosis of native arteries of extremities, bilateral legs: Secondary | ICD-10-CM | POA: Diagnosis not present

## 2016-03-27 DIAGNOSIS — M79675 Pain in left toe(s): Secondary | ICD-10-CM | POA: Diagnosis not present

## 2016-03-28 DIAGNOSIS — S72043A Displaced fracture of base of neck of unspecified femur, initial encounter for closed fracture: Secondary | ICD-10-CM | POA: Diagnosis not present

## 2016-04-12 DIAGNOSIS — J449 Chronic obstructive pulmonary disease, unspecified: Secondary | ICD-10-CM | POA: Diagnosis not present

## 2016-04-12 DIAGNOSIS — E440 Moderate protein-calorie malnutrition: Secondary | ICD-10-CM | POA: Diagnosis not present

## 2016-04-12 DIAGNOSIS — I69351 Hemiplegia and hemiparesis following cerebral infarction affecting right dominant side: Secondary | ICD-10-CM | POA: Diagnosis not present

## 2016-04-27 DIAGNOSIS — F39 Unspecified mood [affective] disorder: Secondary | ICD-10-CM | POA: Diagnosis not present

## 2016-05-07 DIAGNOSIS — E871 Hypo-osmolality and hyponatremia: Secondary | ICD-10-CM | POA: Diagnosis not present

## 2016-05-07 DIAGNOSIS — J449 Chronic obstructive pulmonary disease, unspecified: Secondary | ICD-10-CM | POA: Diagnosis not present

## 2016-05-08 DIAGNOSIS — M2450 Contracture, unspecified joint: Secondary | ICD-10-CM | POA: Diagnosis not present

## 2016-05-08 DIAGNOSIS — R918 Other nonspecific abnormal finding of lung field: Secondary | ICD-10-CM | POA: Diagnosis not present

## 2016-05-08 DIAGNOSIS — R4182 Altered mental status, unspecified: Secondary | ICD-10-CM | POA: Diagnosis not present

## 2016-05-08 DIAGNOSIS — F17210 Nicotine dependence, cigarettes, uncomplicated: Secondary | ICD-10-CM | POA: Diagnosis not present

## 2016-05-08 DIAGNOSIS — I69322 Dysarthria following cerebral infarction: Secondary | ICD-10-CM | POA: Diagnosis not present

## 2016-05-08 DIAGNOSIS — J449 Chronic obstructive pulmonary disease, unspecified: Secondary | ICD-10-CM | POA: Diagnosis not present

## 2016-05-08 DIAGNOSIS — S199XXA Unspecified injury of neck, initial encounter: Secondary | ICD-10-CM | POA: Diagnosis not present

## 2016-05-08 DIAGNOSIS — S0990XA Unspecified injury of head, initial encounter: Secondary | ICD-10-CM | POA: Diagnosis not present

## 2016-05-08 DIAGNOSIS — Z049 Encounter for examination and observation for unspecified reason: Secondary | ICD-10-CM | POA: Diagnosis not present

## 2016-05-11 DIAGNOSIS — I69351 Hemiplegia and hemiparesis following cerebral infarction affecting right dominant side: Secondary | ICD-10-CM | POA: Diagnosis not present

## 2016-05-11 DIAGNOSIS — J449 Chronic obstructive pulmonary disease, unspecified: Secondary | ICD-10-CM | POA: Diagnosis not present

## 2016-05-11 DIAGNOSIS — E440 Moderate protein-calorie malnutrition: Secondary | ICD-10-CM | POA: Diagnosis not present

## 2016-05-11 DIAGNOSIS — G40802 Other epilepsy, not intractable, without status epilepticus: Secondary | ICD-10-CM | POA: Diagnosis not present

## 2016-05-30 DIAGNOSIS — G40301 Generalized idiopathic epilepsy and epileptic syndromes, not intractable, with status epilepticus: Secondary | ICD-10-CM | POA: Diagnosis not present

## 2016-06-14 DIAGNOSIS — J449 Chronic obstructive pulmonary disease, unspecified: Secondary | ICD-10-CM | POA: Diagnosis not present

## 2016-06-14 DIAGNOSIS — E43 Unspecified severe protein-calorie malnutrition: Secondary | ICD-10-CM | POA: Diagnosis not present

## 2016-06-14 DIAGNOSIS — F320 Major depressive disorder, single episode, mild: Secondary | ICD-10-CM | POA: Diagnosis not present

## 2016-07-06 DIAGNOSIS — F331 Major depressive disorder, recurrent, moderate: Secondary | ICD-10-CM | POA: Diagnosis not present

## 2016-07-06 DIAGNOSIS — E440 Moderate protein-calorie malnutrition: Secondary | ICD-10-CM | POA: Diagnosis not present

## 2016-07-06 DIAGNOSIS — J449 Chronic obstructive pulmonary disease, unspecified: Secondary | ICD-10-CM | POA: Diagnosis not present

## 2016-07-24 DIAGNOSIS — M79675 Pain in left toe(s): Secondary | ICD-10-CM | POA: Diagnosis not present

## 2016-07-24 DIAGNOSIS — B351 Tinea unguium: Secondary | ICD-10-CM | POA: Diagnosis not present

## 2016-07-24 DIAGNOSIS — I70203 Unspecified atherosclerosis of native arteries of extremities, bilateral legs: Secondary | ICD-10-CM | POA: Diagnosis not present

## 2016-07-24 DIAGNOSIS — M79674 Pain in right toe(s): Secondary | ICD-10-CM | POA: Diagnosis not present

## 2016-08-02 DIAGNOSIS — F331 Major depressive disorder, recurrent, moderate: Secondary | ICD-10-CM | POA: Diagnosis not present

## 2016-08-02 DIAGNOSIS — E440 Moderate protein-calorie malnutrition: Secondary | ICD-10-CM | POA: Diagnosis not present

## 2016-08-02 DIAGNOSIS — J449 Chronic obstructive pulmonary disease, unspecified: Secondary | ICD-10-CM | POA: Diagnosis not present

## 2016-08-24 DIAGNOSIS — J449 Chronic obstructive pulmonary disease, unspecified: Secondary | ICD-10-CM | POA: Diagnosis not present

## 2016-08-24 DIAGNOSIS — J90 Pleural effusion, not elsewhere classified: Secondary | ICD-10-CM | POA: Diagnosis not present

## 2016-08-24 DIAGNOSIS — R05 Cough: Secondary | ICD-10-CM | POA: Diagnosis not present

## 2016-08-31 DIAGNOSIS — J449 Chronic obstructive pulmonary disease, unspecified: Secondary | ICD-10-CM | POA: Diagnosis not present

## 2016-08-31 DIAGNOSIS — E440 Moderate protein-calorie malnutrition: Secondary | ICD-10-CM | POA: Diagnosis not present

## 2016-08-31 DIAGNOSIS — F331 Major depressive disorder, recurrent, moderate: Secondary | ICD-10-CM | POA: Diagnosis not present

## 2016-09-21 DIAGNOSIS — R05 Cough: Secondary | ICD-10-CM | POA: Diagnosis not present

## 2016-10-05 DIAGNOSIS — J449 Chronic obstructive pulmonary disease, unspecified: Secondary | ICD-10-CM | POA: Diagnosis not present

## 2016-10-05 DIAGNOSIS — M25551 Pain in right hip: Secondary | ICD-10-CM | POA: Diagnosis not present

## 2016-10-05 DIAGNOSIS — Z96641 Presence of right artificial hip joint: Secondary | ICD-10-CM | POA: Diagnosis not present

## 2016-10-05 DIAGNOSIS — E440 Moderate protein-calorie malnutrition: Secondary | ICD-10-CM | POA: Diagnosis not present

## 2016-10-28 DIAGNOSIS — I69351 Hemiplegia and hemiparesis following cerebral infarction affecting right dominant side: Secondary | ICD-10-CM | POA: Diagnosis not present

## 2016-10-28 DIAGNOSIS — J449 Chronic obstructive pulmonary disease, unspecified: Secondary | ICD-10-CM | POA: Diagnosis not present

## 2016-10-31 DIAGNOSIS — R569 Unspecified convulsions: Secondary | ICD-10-CM | POA: Diagnosis not present

## 2016-11-01 DIAGNOSIS — M79674 Pain in right toe(s): Secondary | ICD-10-CM | POA: Diagnosis not present

## 2016-11-01 DIAGNOSIS — I70203 Unspecified atherosclerosis of native arteries of extremities, bilateral legs: Secondary | ICD-10-CM | POA: Diagnosis not present

## 2016-11-01 DIAGNOSIS — B351 Tinea unguium: Secondary | ICD-10-CM | POA: Diagnosis not present

## 2016-11-01 DIAGNOSIS — M79675 Pain in left toe(s): Secondary | ICD-10-CM | POA: Diagnosis not present

## 2016-12-07 DIAGNOSIS — I69351 Hemiplegia and hemiparesis following cerebral infarction affecting right dominant side: Secondary | ICD-10-CM | POA: Diagnosis not present

## 2016-12-07 DIAGNOSIS — J449 Chronic obstructive pulmonary disease, unspecified: Secondary | ICD-10-CM | POA: Diagnosis not present

## 2016-12-28 DIAGNOSIS — G40909 Epilepsy, unspecified, not intractable, without status epilepticus: Secondary | ICD-10-CM | POA: Diagnosis not present

## 2016-12-28 DIAGNOSIS — J449 Chronic obstructive pulmonary disease, unspecified: Secondary | ICD-10-CM | POA: Diagnosis not present

## 2016-12-28 DIAGNOSIS — I69351 Hemiplegia and hemiparesis following cerebral infarction affecting right dominant side: Secondary | ICD-10-CM | POA: Diagnosis not present

## 2017-01-09 DIAGNOSIS — G40301 Generalized idiopathic epilepsy and epileptic syndromes, not intractable, with status epilepticus: Secondary | ICD-10-CM | POA: Diagnosis not present

## 2017-01-10 DIAGNOSIS — T420X1A Poisoning by hydantoin derivatives, accidental (unintentional), initial encounter: Secondary | ICD-10-CM | POA: Diagnosis not present

## 2017-01-10 DIAGNOSIS — R270 Ataxia, unspecified: Secondary | ICD-10-CM | POA: Diagnosis not present

## 2017-01-14 DIAGNOSIS — G40301 Generalized idiopathic epilepsy and epileptic syndromes, not intractable, with status epilepticus: Secondary | ICD-10-CM | POA: Diagnosis not present

## 2017-01-25 DIAGNOSIS — G40909 Epilepsy, unspecified, not intractable, without status epilepticus: Secondary | ICD-10-CM | POA: Diagnosis not present

## 2017-02-14 DIAGNOSIS — H40023 Open angle with borderline findings, high risk, bilateral: Secondary | ICD-10-CM | POA: Diagnosis not present

## 2017-02-14 DIAGNOSIS — H2513 Age-related nuclear cataract, bilateral: Secondary | ICD-10-CM | POA: Diagnosis not present

## 2017-03-01 DIAGNOSIS — L84 Corns and callosities: Secondary | ICD-10-CM | POA: Diagnosis not present

## 2017-03-01 DIAGNOSIS — B351 Tinea unguium: Secondary | ICD-10-CM | POA: Diagnosis not present

## 2017-03-01 DIAGNOSIS — M2012 Hallux valgus (acquired), left foot: Secondary | ICD-10-CM | POA: Diagnosis not present

## 2017-03-01 DIAGNOSIS — I69993 Ataxia following unspecified cerebrovascular disease: Secondary | ICD-10-CM | POA: Diagnosis not present

## 2017-03-01 DIAGNOSIS — L603 Nail dystrophy: Secondary | ICD-10-CM | POA: Diagnosis not present

## 2017-03-01 DIAGNOSIS — I739 Peripheral vascular disease, unspecified: Secondary | ICD-10-CM | POA: Diagnosis not present

## 2017-03-07 DIAGNOSIS — I69398 Other sequelae of cerebral infarction: Secondary | ICD-10-CM | POA: Diagnosis not present

## 2017-03-07 DIAGNOSIS — G40909 Epilepsy, unspecified, not intractable, without status epilepticus: Secondary | ICD-10-CM | POA: Diagnosis not present

## 2017-04-05 DIAGNOSIS — G40909 Epilepsy, unspecified, not intractable, without status epilepticus: Secondary | ICD-10-CM | POA: Diagnosis not present

## 2017-04-05 DIAGNOSIS — J449 Chronic obstructive pulmonary disease, unspecified: Secondary | ICD-10-CM | POA: Diagnosis not present

## 2017-04-05 DIAGNOSIS — E440 Moderate protein-calorie malnutrition: Secondary | ICD-10-CM | POA: Diagnosis not present

## 2017-05-07 DIAGNOSIS — M19041 Primary osteoarthritis, right hand: Secondary | ICD-10-CM | POA: Diagnosis not present

## 2017-05-09 DIAGNOSIS — I69398 Other sequelae of cerebral infarction: Secondary | ICD-10-CM | POA: Diagnosis not present

## 2017-05-09 DIAGNOSIS — J449 Chronic obstructive pulmonary disease, unspecified: Secondary | ICD-10-CM | POA: Diagnosis not present

## 2017-05-09 DIAGNOSIS — E440 Moderate protein-calorie malnutrition: Secondary | ICD-10-CM | POA: Diagnosis not present

## 2017-06-07 DIAGNOSIS — I69398 Other sequelae of cerebral infarction: Secondary | ICD-10-CM | POA: Diagnosis not present

## 2017-06-07 DIAGNOSIS — J449 Chronic obstructive pulmonary disease, unspecified: Secondary | ICD-10-CM | POA: Diagnosis not present

## 2017-06-07 DIAGNOSIS — E440 Moderate protein-calorie malnutrition: Secondary | ICD-10-CM | POA: Diagnosis not present

## 2017-06-22 DIAGNOSIS — Z23 Encounter for immunization: Secondary | ICD-10-CM | POA: Diagnosis not present

## 2017-06-22 DIAGNOSIS — M0689 Other specified rheumatoid arthritis, multiple sites: Secondary | ICD-10-CM | POA: Diagnosis not present

## 2017-06-22 DIAGNOSIS — R279 Unspecified lack of coordination: Secondary | ICD-10-CM | POA: Diagnosis not present

## 2017-06-22 DIAGNOSIS — F0151 Vascular dementia with behavioral disturbance: Secondary | ICD-10-CM | POA: Diagnosis not present

## 2017-06-22 DIAGNOSIS — J449 Chronic obstructive pulmonary disease, unspecified: Secondary | ICD-10-CM | POA: Diagnosis not present

## 2017-06-24 DIAGNOSIS — M0689 Other specified rheumatoid arthritis, multiple sites: Secondary | ICD-10-CM | POA: Diagnosis not present

## 2017-06-24 DIAGNOSIS — Z23 Encounter for immunization: Secondary | ICD-10-CM | POA: Diagnosis not present

## 2017-06-24 DIAGNOSIS — R279 Unspecified lack of coordination: Secondary | ICD-10-CM | POA: Diagnosis not present

## 2017-06-24 DIAGNOSIS — J449 Chronic obstructive pulmonary disease, unspecified: Secondary | ICD-10-CM | POA: Diagnosis not present

## 2017-06-24 DIAGNOSIS — F0151 Vascular dementia with behavioral disturbance: Secondary | ICD-10-CM | POA: Diagnosis not present

## 2017-06-28 DIAGNOSIS — J449 Chronic obstructive pulmonary disease, unspecified: Secondary | ICD-10-CM | POA: Diagnosis not present

## 2017-06-28 DIAGNOSIS — M0689 Other specified rheumatoid arthritis, multiple sites: Secondary | ICD-10-CM | POA: Diagnosis not present

## 2017-06-28 DIAGNOSIS — R279 Unspecified lack of coordination: Secondary | ICD-10-CM | POA: Diagnosis not present

## 2017-06-28 DIAGNOSIS — F0151 Vascular dementia with behavioral disturbance: Secondary | ICD-10-CM | POA: Diagnosis not present

## 2017-07-01 DIAGNOSIS — M0689 Other specified rheumatoid arthritis, multiple sites: Secondary | ICD-10-CM | POA: Diagnosis not present

## 2017-07-01 DIAGNOSIS — R279 Unspecified lack of coordination: Secondary | ICD-10-CM | POA: Diagnosis not present

## 2017-07-01 DIAGNOSIS — J449 Chronic obstructive pulmonary disease, unspecified: Secondary | ICD-10-CM | POA: Diagnosis not present

## 2017-07-01 DIAGNOSIS — F0151 Vascular dementia with behavioral disturbance: Secondary | ICD-10-CM | POA: Diagnosis not present

## 2017-07-02 DIAGNOSIS — J449 Chronic obstructive pulmonary disease, unspecified: Secondary | ICD-10-CM | POA: Diagnosis not present

## 2017-07-02 DIAGNOSIS — R279 Unspecified lack of coordination: Secondary | ICD-10-CM | POA: Diagnosis not present

## 2017-07-02 DIAGNOSIS — M0689 Other specified rheumatoid arthritis, multiple sites: Secondary | ICD-10-CM | POA: Diagnosis not present

## 2017-07-02 DIAGNOSIS — F0151 Vascular dementia with behavioral disturbance: Secondary | ICD-10-CM | POA: Diagnosis not present

## 2017-07-03 DIAGNOSIS — J449 Chronic obstructive pulmonary disease, unspecified: Secondary | ICD-10-CM | POA: Diagnosis not present

## 2017-07-03 DIAGNOSIS — F0151 Vascular dementia with behavioral disturbance: Secondary | ICD-10-CM | POA: Diagnosis not present

## 2017-07-03 DIAGNOSIS — M0689 Other specified rheumatoid arthritis, multiple sites: Secondary | ICD-10-CM | POA: Diagnosis not present

## 2017-07-03 DIAGNOSIS — R279 Unspecified lack of coordination: Secondary | ICD-10-CM | POA: Diagnosis not present

## 2017-07-05 DIAGNOSIS — J449 Chronic obstructive pulmonary disease, unspecified: Secondary | ICD-10-CM | POA: Diagnosis not present

## 2017-07-08 DIAGNOSIS — M0689 Other specified rheumatoid arthritis, multiple sites: Secondary | ICD-10-CM | POA: Diagnosis not present

## 2017-07-08 DIAGNOSIS — R279 Unspecified lack of coordination: Secondary | ICD-10-CM | POA: Diagnosis not present

## 2017-07-08 DIAGNOSIS — F0151 Vascular dementia with behavioral disturbance: Secondary | ICD-10-CM | POA: Diagnosis not present

## 2017-07-08 DIAGNOSIS — J449 Chronic obstructive pulmonary disease, unspecified: Secondary | ICD-10-CM | POA: Diagnosis not present

## 2017-07-23 DIAGNOSIS — M2041 Other hammer toe(s) (acquired), right foot: Secondary | ICD-10-CM | POA: Diagnosis not present

## 2017-07-23 DIAGNOSIS — B351 Tinea unguium: Secondary | ICD-10-CM | POA: Diagnosis not present

## 2017-07-23 DIAGNOSIS — I739 Peripheral vascular disease, unspecified: Secondary | ICD-10-CM | POA: Diagnosis not present

## 2017-07-23 DIAGNOSIS — L603 Nail dystrophy: Secondary | ICD-10-CM | POA: Diagnosis not present

## 2017-07-23 DIAGNOSIS — M2042 Other hammer toe(s) (acquired), left foot: Secondary | ICD-10-CM | POA: Diagnosis not present

## 2017-08-02 DIAGNOSIS — J069 Acute upper respiratory infection, unspecified: Secondary | ICD-10-CM | POA: Diagnosis not present

## 2017-08-02 DIAGNOSIS — J449 Chronic obstructive pulmonary disease, unspecified: Secondary | ICD-10-CM | POA: Diagnosis not present

## 2017-09-12 DIAGNOSIS — J449 Chronic obstructive pulmonary disease, unspecified: Secondary | ICD-10-CM | POA: Diagnosis not present

## 2017-09-12 DIAGNOSIS — E440 Moderate protein-calorie malnutrition: Secondary | ICD-10-CM | POA: Diagnosis not present

## 2017-09-12 DIAGNOSIS — I69398 Other sequelae of cerebral infarction: Secondary | ICD-10-CM | POA: Diagnosis not present

## 2017-10-03 DIAGNOSIS — I69319 Unspecified symptoms and signs involving cognitive functions following cerebral infarction: Secondary | ICD-10-CM | POA: Diagnosis not present

## 2017-10-03 DIAGNOSIS — J449 Chronic obstructive pulmonary disease, unspecified: Secondary | ICD-10-CM | POA: Diagnosis not present

## 2017-10-17 DIAGNOSIS — I739 Peripheral vascular disease, unspecified: Secondary | ICD-10-CM | POA: Diagnosis not present

## 2017-10-17 DIAGNOSIS — B351 Tinea unguium: Secondary | ICD-10-CM | POA: Diagnosis not present

## 2017-10-31 DIAGNOSIS — J449 Chronic obstructive pulmonary disease, unspecified: Secondary | ICD-10-CM | POA: Diagnosis not present

## 2017-10-31 DIAGNOSIS — I69319 Unspecified symptoms and signs involving cognitive functions following cerebral infarction: Secondary | ICD-10-CM | POA: Diagnosis not present

## 2017-12-13 DIAGNOSIS — I69398 Other sequelae of cerebral infarction: Secondary | ICD-10-CM | POA: Diagnosis not present

## 2017-12-13 DIAGNOSIS — J449 Chronic obstructive pulmonary disease, unspecified: Secondary | ICD-10-CM | POA: Diagnosis not present

## 2018-01-02 DIAGNOSIS — I6900 Unspecified sequelae of nontraumatic subarachnoid hemorrhage: Secondary | ICD-10-CM | POA: Diagnosis not present

## 2018-01-02 DIAGNOSIS — J449 Chronic obstructive pulmonary disease, unspecified: Secondary | ICD-10-CM | POA: Diagnosis not present

## 2018-01-02 DIAGNOSIS — E440 Moderate protein-calorie malnutrition: Secondary | ICD-10-CM | POA: Diagnosis not present

## 2018-01-16 DIAGNOSIS — B351 Tinea unguium: Secondary | ICD-10-CM | POA: Diagnosis not present

## 2018-01-16 DIAGNOSIS — I739 Peripheral vascular disease, unspecified: Secondary | ICD-10-CM | POA: Diagnosis not present

## 2018-01-28 DIAGNOSIS — H401134 Primary open-angle glaucoma, bilateral, indeterminate stage: Secondary | ICD-10-CM | POA: Diagnosis not present

## 2018-01-28 DIAGNOSIS — H2513 Age-related nuclear cataract, bilateral: Secondary | ICD-10-CM | POA: Diagnosis not present

## 2018-01-28 DIAGNOSIS — Z7952 Long term (current) use of systemic steroids: Secondary | ICD-10-CM | POA: Diagnosis not present

## 2018-02-13 DIAGNOSIS — J449 Chronic obstructive pulmonary disease, unspecified: Secondary | ICD-10-CM | POA: Diagnosis not present

## 2018-02-13 DIAGNOSIS — E440 Moderate protein-calorie malnutrition: Secondary | ICD-10-CM | POA: Diagnosis not present

## 2018-02-13 DIAGNOSIS — I6930 Unspecified sequelae of cerebral infarction: Secondary | ICD-10-CM | POA: Diagnosis not present

## 2018-03-06 DIAGNOSIS — J449 Chronic obstructive pulmonary disease, unspecified: Secondary | ICD-10-CM | POA: Diagnosis not present

## 2018-03-06 DIAGNOSIS — E440 Moderate protein-calorie malnutrition: Secondary | ICD-10-CM | POA: Diagnosis not present

## 2018-03-26 DIAGNOSIS — L84 Corns and callosities: Secondary | ICD-10-CM | POA: Diagnosis not present

## 2018-03-26 DIAGNOSIS — B351 Tinea unguium: Secondary | ICD-10-CM | POA: Diagnosis not present

## 2018-03-26 DIAGNOSIS — I739 Peripheral vascular disease, unspecified: Secondary | ICD-10-CM | POA: Diagnosis not present

## 2018-03-26 DIAGNOSIS — M2042 Other hammer toe(s) (acquired), left foot: Secondary | ICD-10-CM | POA: Diagnosis not present

## 2018-04-11 DIAGNOSIS — I6930 Unspecified sequelae of cerebral infarction: Secondary | ICD-10-CM | POA: Diagnosis not present

## 2018-04-11 DIAGNOSIS — J449 Chronic obstructive pulmonary disease, unspecified: Secondary | ICD-10-CM | POA: Diagnosis not present

## 2018-04-11 DIAGNOSIS — E440 Moderate protein-calorie malnutrition: Secondary | ICD-10-CM | POA: Diagnosis not present

## 2018-05-08 DIAGNOSIS — E440 Moderate protein-calorie malnutrition: Secondary | ICD-10-CM | POA: Diagnosis not present

## 2018-05-08 DIAGNOSIS — J449 Chronic obstructive pulmonary disease, unspecified: Secondary | ICD-10-CM | POA: Diagnosis not present

## 2018-05-08 DIAGNOSIS — I6930 Unspecified sequelae of cerebral infarction: Secondary | ICD-10-CM | POA: Diagnosis not present

## 2018-05-08 DIAGNOSIS — G40909 Epilepsy, unspecified, not intractable, without status epilepticus: Secondary | ICD-10-CM | POA: Diagnosis not present

## 2018-05-30 DIAGNOSIS — E440 Moderate protein-calorie malnutrition: Secondary | ICD-10-CM | POA: Diagnosis not present

## 2018-05-30 DIAGNOSIS — G40909 Epilepsy, unspecified, not intractable, without status epilepticus: Secondary | ICD-10-CM | POA: Diagnosis not present

## 2018-05-30 DIAGNOSIS — I6930 Unspecified sequelae of cerebral infarction: Secondary | ICD-10-CM | POA: Diagnosis not present

## 2018-05-30 DIAGNOSIS — J449 Chronic obstructive pulmonary disease, unspecified: Secondary | ICD-10-CM | POA: Diagnosis not present

## 2018-06-05 DIAGNOSIS — Z23 Encounter for immunization: Secondary | ICD-10-CM | POA: Diagnosis not present

## 2018-06-06 DIAGNOSIS — L84 Corns and callosities: Secondary | ICD-10-CM | POA: Diagnosis not present

## 2018-06-06 DIAGNOSIS — B351 Tinea unguium: Secondary | ICD-10-CM | POA: Diagnosis not present

## 2018-06-06 DIAGNOSIS — L603 Nail dystrophy: Secondary | ICD-10-CM | POA: Diagnosis not present

## 2018-06-06 DIAGNOSIS — I739 Peripheral vascular disease, unspecified: Secondary | ICD-10-CM | POA: Diagnosis not present

## 2018-07-10 DIAGNOSIS — E440 Moderate protein-calorie malnutrition: Secondary | ICD-10-CM | POA: Diagnosis not present

## 2018-07-10 DIAGNOSIS — J449 Chronic obstructive pulmonary disease, unspecified: Secondary | ICD-10-CM | POA: Diagnosis not present

## 2018-07-10 DIAGNOSIS — G40909 Epilepsy, unspecified, not intractable, without status epilepticus: Secondary | ICD-10-CM | POA: Diagnosis not present

## 2018-07-10 DIAGNOSIS — I6930 Unspecified sequelae of cerebral infarction: Secondary | ICD-10-CM | POA: Diagnosis not present

## 2018-08-01 DIAGNOSIS — G40909 Epilepsy, unspecified, not intractable, without status epilepticus: Secondary | ICD-10-CM | POA: Diagnosis not present

## 2018-08-01 DIAGNOSIS — I6930 Unspecified sequelae of cerebral infarction: Secondary | ICD-10-CM | POA: Diagnosis not present

## 2018-08-01 DIAGNOSIS — E440 Moderate protein-calorie malnutrition: Secondary | ICD-10-CM | POA: Diagnosis not present

## 2018-08-01 DIAGNOSIS — J449 Chronic obstructive pulmonary disease, unspecified: Secondary | ICD-10-CM | POA: Diagnosis not present

## 2018-09-04 DIAGNOSIS — G40909 Epilepsy, unspecified, not intractable, without status epilepticus: Secondary | ICD-10-CM | POA: Diagnosis not present

## 2018-09-04 DIAGNOSIS — I6930 Unspecified sequelae of cerebral infarction: Secondary | ICD-10-CM | POA: Diagnosis not present

## 2018-09-04 DIAGNOSIS — J449 Chronic obstructive pulmonary disease, unspecified: Secondary | ICD-10-CM | POA: Diagnosis not present

## 2018-09-04 DIAGNOSIS — E440 Moderate protein-calorie malnutrition: Secondary | ICD-10-CM | POA: Diagnosis not present

## 2018-10-03 DIAGNOSIS — I6930 Unspecified sequelae of cerebral infarction: Secondary | ICD-10-CM | POA: Diagnosis not present

## 2018-10-03 DIAGNOSIS — G40909 Epilepsy, unspecified, not intractable, without status epilepticus: Secondary | ICD-10-CM | POA: Diagnosis not present

## 2018-10-03 DIAGNOSIS — J449 Chronic obstructive pulmonary disease, unspecified: Secondary | ICD-10-CM | POA: Diagnosis not present

## 2018-10-03 DIAGNOSIS — E440 Moderate protein-calorie malnutrition: Secondary | ICD-10-CM | POA: Diagnosis not present

## 2018-10-23 DIAGNOSIS — M2041 Other hammer toe(s) (acquired), right foot: Secondary | ICD-10-CM | POA: Diagnosis not present

## 2018-10-23 DIAGNOSIS — I739 Peripheral vascular disease, unspecified: Secondary | ICD-10-CM | POA: Diagnosis not present

## 2018-10-23 DIAGNOSIS — B351 Tinea unguium: Secondary | ICD-10-CM | POA: Diagnosis not present

## 2018-10-23 DIAGNOSIS — M2042 Other hammer toe(s) (acquired), left foot: Secondary | ICD-10-CM | POA: Diagnosis not present

## 2018-10-31 DIAGNOSIS — Z87820 Personal history of traumatic brain injury: Secondary | ICD-10-CM | POA: Diagnosis not present

## 2018-10-31 DIAGNOSIS — J449 Chronic obstructive pulmonary disease, unspecified: Secondary | ICD-10-CM | POA: Diagnosis not present

## 2018-10-31 DIAGNOSIS — G40909 Epilepsy, unspecified, not intractable, without status epilepticus: Secondary | ICD-10-CM | POA: Diagnosis not present

## 2018-10-31 DIAGNOSIS — F411 Generalized anxiety disorder: Secondary | ICD-10-CM | POA: Diagnosis not present

## 2018-12-05 DIAGNOSIS — J449 Chronic obstructive pulmonary disease, unspecified: Secondary | ICD-10-CM | POA: Diagnosis not present

## 2018-12-05 DIAGNOSIS — G40909 Epilepsy, unspecified, not intractable, without status epilepticus: Secondary | ICD-10-CM | POA: Diagnosis not present

## 2018-12-05 DIAGNOSIS — E440 Moderate protein-calorie malnutrition: Secondary | ICD-10-CM | POA: Diagnosis not present

## 2018-12-05 DIAGNOSIS — I6930 Unspecified sequelae of cerebral infarction: Secondary | ICD-10-CM | POA: Diagnosis not present

## 2018-12-31 DIAGNOSIS — Z20828 Contact with and (suspected) exposure to other viral communicable diseases: Secondary | ICD-10-CM | POA: Diagnosis not present

## 2019-01-13 DIAGNOSIS — Z03818 Encounter for observation for suspected exposure to other biological agents ruled out: Secondary | ICD-10-CM | POA: Diagnosis not present

## 2019-01-16 DIAGNOSIS — E440 Moderate protein-calorie malnutrition: Secondary | ICD-10-CM | POA: Diagnosis not present

## 2019-01-16 DIAGNOSIS — I6930 Unspecified sequelae of cerebral infarction: Secondary | ICD-10-CM | POA: Diagnosis not present

## 2019-01-16 DIAGNOSIS — G40909 Epilepsy, unspecified, not intractable, without status epilepticus: Secondary | ICD-10-CM | POA: Diagnosis not present

## 2019-01-16 DIAGNOSIS — J449 Chronic obstructive pulmonary disease, unspecified: Secondary | ICD-10-CM | POA: Diagnosis not present

## 2019-01-26 DIAGNOSIS — Z03818 Encounter for observation for suspected exposure to other biological agents ruled out: Secondary | ICD-10-CM | POA: Diagnosis not present

## 2019-02-05 DIAGNOSIS — Z03818 Encounter for observation for suspected exposure to other biological agents ruled out: Secondary | ICD-10-CM | POA: Diagnosis not present

## 2019-02-17 DIAGNOSIS — Z03818 Encounter for observation for suspected exposure to other biological agents ruled out: Secondary | ICD-10-CM | POA: Diagnosis not present

## 2019-02-20 DIAGNOSIS — E440 Moderate protein-calorie malnutrition: Secondary | ICD-10-CM | POA: Diagnosis not present

## 2019-02-20 DIAGNOSIS — Z20828 Contact with and (suspected) exposure to other viral communicable diseases: Secondary | ICD-10-CM | POA: Diagnosis not present

## 2019-02-20 DIAGNOSIS — G40909 Epilepsy, unspecified, not intractable, without status epilepticus: Secondary | ICD-10-CM | POA: Diagnosis not present

## 2019-02-20 DIAGNOSIS — J449 Chronic obstructive pulmonary disease, unspecified: Secondary | ICD-10-CM | POA: Diagnosis not present

## 2019-02-20 DIAGNOSIS — I6930 Unspecified sequelae of cerebral infarction: Secondary | ICD-10-CM | POA: Diagnosis not present

## 2019-03-12 DIAGNOSIS — F0151 Vascular dementia with behavioral disturbance: Secondary | ICD-10-CM | POA: Diagnosis not present

## 2019-03-13 DIAGNOSIS — Z03818 Encounter for observation for suspected exposure to other biological agents ruled out: Secondary | ICD-10-CM | POA: Diagnosis not present

## 2019-03-13 DIAGNOSIS — F0151 Vascular dementia with behavioral disturbance: Secondary | ICD-10-CM | POA: Diagnosis not present

## 2019-03-16 DIAGNOSIS — F0151 Vascular dementia with behavioral disturbance: Secondary | ICD-10-CM | POA: Diagnosis not present

## 2019-03-17 DIAGNOSIS — F0151 Vascular dementia with behavioral disturbance: Secondary | ICD-10-CM | POA: Diagnosis not present

## 2019-03-18 DIAGNOSIS — F0151 Vascular dementia with behavioral disturbance: Secondary | ICD-10-CM | POA: Diagnosis not present

## 2019-03-19 DIAGNOSIS — F0151 Vascular dementia with behavioral disturbance: Secondary | ICD-10-CM | POA: Diagnosis not present

## 2019-03-19 DIAGNOSIS — Z20828 Contact with and (suspected) exposure to other viral communicable diseases: Secondary | ICD-10-CM | POA: Diagnosis not present

## 2019-03-19 DIAGNOSIS — I6930 Unspecified sequelae of cerebral infarction: Secondary | ICD-10-CM | POA: Diagnosis not present

## 2019-03-19 DIAGNOSIS — J449 Chronic obstructive pulmonary disease, unspecified: Secondary | ICD-10-CM | POA: Diagnosis not present

## 2019-03-19 DIAGNOSIS — G40909 Epilepsy, unspecified, not intractable, without status epilepticus: Secondary | ICD-10-CM | POA: Diagnosis not present

## 2019-03-20 DIAGNOSIS — R569 Unspecified convulsions: Secondary | ICD-10-CM | POA: Diagnosis not present

## 2019-03-20 DIAGNOSIS — F0151 Vascular dementia with behavioral disturbance: Secondary | ICD-10-CM | POA: Diagnosis not present

## 2019-03-23 DIAGNOSIS — F0151 Vascular dementia with behavioral disturbance: Secondary | ICD-10-CM | POA: Diagnosis not present

## 2019-03-25 DIAGNOSIS — Z03818 Encounter for observation for suspected exposure to other biological agents ruled out: Secondary | ICD-10-CM | POA: Diagnosis not present

## 2019-04-01 DIAGNOSIS — Z03818 Encounter for observation for suspected exposure to other biological agents ruled out: Secondary | ICD-10-CM | POA: Diagnosis not present

## 2019-04-09 DIAGNOSIS — J449 Chronic obstructive pulmonary disease, unspecified: Secondary | ICD-10-CM | POA: Diagnosis not present

## 2019-04-09 DIAGNOSIS — I6930 Unspecified sequelae of cerebral infarction: Secondary | ICD-10-CM | POA: Diagnosis not present

## 2019-04-09 DIAGNOSIS — G40909 Epilepsy, unspecified, not intractable, without status epilepticus: Secondary | ICD-10-CM | POA: Diagnosis not present

## 2019-04-13 DIAGNOSIS — F064 Anxiety disorder due to known physiological condition: Secondary | ICD-10-CM | POA: Diagnosis not present

## 2019-04-13 DIAGNOSIS — F331 Major depressive disorder, recurrent, moderate: Secondary | ICD-10-CM | POA: Diagnosis not present

## 2019-04-13 DIAGNOSIS — F1011 Alcohol abuse, in remission: Secondary | ICD-10-CM | POA: Diagnosis not present

## 2019-05-08 DIAGNOSIS — I6930 Unspecified sequelae of cerebral infarction: Secondary | ICD-10-CM | POA: Diagnosis not present

## 2019-05-08 DIAGNOSIS — J449 Chronic obstructive pulmonary disease, unspecified: Secondary | ICD-10-CM | POA: Diagnosis not present

## 2019-05-08 DIAGNOSIS — G40909 Epilepsy, unspecified, not intractable, without status epilepticus: Secondary | ICD-10-CM | POA: Diagnosis not present

## 2019-05-28 DIAGNOSIS — F1011 Alcohol abuse, in remission: Secondary | ICD-10-CM | POA: Diagnosis not present

## 2019-05-28 DIAGNOSIS — F064 Anxiety disorder due to known physiological condition: Secondary | ICD-10-CM | POA: Diagnosis not present

## 2019-05-28 DIAGNOSIS — F331 Major depressive disorder, recurrent, moderate: Secondary | ICD-10-CM | POA: Diagnosis not present

## 2019-06-11 DIAGNOSIS — J449 Chronic obstructive pulmonary disease, unspecified: Secondary | ICD-10-CM | POA: Diagnosis not present

## 2019-06-11 DIAGNOSIS — G40909 Epilepsy, unspecified, not intractable, without status epilepticus: Secondary | ICD-10-CM | POA: Diagnosis not present

## 2019-06-11 DIAGNOSIS — I6930 Unspecified sequelae of cerebral infarction: Secondary | ICD-10-CM | POA: Diagnosis not present

## 2019-06-16 DIAGNOSIS — Z23 Encounter for immunization: Secondary | ICD-10-CM | POA: Diagnosis not present

## 2019-06-22 DIAGNOSIS — F1011 Alcohol abuse, in remission: Secondary | ICD-10-CM | POA: Diagnosis not present

## 2019-06-22 DIAGNOSIS — F064 Anxiety disorder due to known physiological condition: Secondary | ICD-10-CM | POA: Diagnosis not present

## 2019-06-22 DIAGNOSIS — F331 Major depressive disorder, recurrent, moderate: Secondary | ICD-10-CM | POA: Diagnosis not present

## 2019-07-03 DIAGNOSIS — J449 Chronic obstructive pulmonary disease, unspecified: Secondary | ICD-10-CM | POA: Diagnosis not present

## 2019-07-03 DIAGNOSIS — G40909 Epilepsy, unspecified, not intractable, without status epilepticus: Secondary | ICD-10-CM | POA: Diagnosis not present

## 2019-07-03 DIAGNOSIS — I6930 Unspecified sequelae of cerebral infarction: Secondary | ICD-10-CM | POA: Diagnosis not present

## 2019-07-04 DIAGNOSIS — Z20828 Contact with and (suspected) exposure to other viral communicable diseases: Secondary | ICD-10-CM | POA: Diagnosis not present

## 2019-07-20 DIAGNOSIS — F1011 Alcohol abuse, in remission: Secondary | ICD-10-CM | POA: Diagnosis not present

## 2019-07-20 DIAGNOSIS — F331 Major depressive disorder, recurrent, moderate: Secondary | ICD-10-CM | POA: Diagnosis not present

## 2019-07-20 DIAGNOSIS — F064 Anxiety disorder due to known physiological condition: Secondary | ICD-10-CM | POA: Diagnosis not present

## 2019-08-03 DIAGNOSIS — F064 Anxiety disorder due to known physiological condition: Secondary | ICD-10-CM | POA: Diagnosis not present

## 2019-08-03 DIAGNOSIS — F1011 Alcohol abuse, in remission: Secondary | ICD-10-CM | POA: Diagnosis not present

## 2019-08-03 DIAGNOSIS — F331 Major depressive disorder, recurrent, moderate: Secondary | ICD-10-CM | POA: Diagnosis not present

## 2019-08-10 DIAGNOSIS — F064 Anxiety disorder due to known physiological condition: Secondary | ICD-10-CM | POA: Diagnosis not present

## 2019-08-10 DIAGNOSIS — F331 Major depressive disorder, recurrent, moderate: Secondary | ICD-10-CM | POA: Diagnosis not present

## 2019-08-10 DIAGNOSIS — F1011 Alcohol abuse, in remission: Secondary | ICD-10-CM | POA: Diagnosis not present

## 2021-09-27 DEATH — deceased
